# Patient Record
Sex: Female | Born: 1960 | Hispanic: Refuse to answer
Health system: Midwestern US, Community
[De-identification: ages and names within clinical notes are randomized; demographics above are authoritative.]

## PROBLEM LIST (undated history)

## (undated) DIAGNOSIS — K449 Diaphragmatic hernia without obstruction or gangrene: Secondary | ICD-10-CM

## (undated) DIAGNOSIS — M549 Dorsalgia, unspecified: Secondary | ICD-10-CM

## (undated) DIAGNOSIS — K121 Other forms of stomatitis: Secondary | ICD-10-CM

## (undated) DIAGNOSIS — G8929 Other chronic pain: Secondary | ICD-10-CM

## (undated) DIAGNOSIS — T148XXA Other injury of unspecified body region, initial encounter: Secondary | ICD-10-CM

## (undated) DIAGNOSIS — M48061 Spinal stenosis, lumbar region without neurogenic claudication: Secondary | ICD-10-CM

## (undated) HISTORY — PX: HERNIA REPAIR: SHX51

## (undated) HISTORY — PX: ABDOMINAL HYSTERECTOMY: SHX81

## (undated) HISTORY — PX: BACK SURGERY: SHX140

## (undated) MED ORDER — OXYCODONE-ACETAMINOPHEN 5 MG-325 MG TAB
5-325 mg | ORAL_TABLET | ORAL | Status: DC | PRN
Start: ? — End: 2013-05-06

## (undated) MED ORDER — OXYCODONE 10 MG TAB
10 mg | ORAL_TABLET | ORAL | Status: DC | PRN
Start: ? — End: 2014-03-29

## (undated) MED ORDER — METHOCARBAMOL 750 MG TAB
750 mg | ORAL_TABLET | Freq: Four times a day (QID) | ORAL | Status: AC
Start: ? — End: 2013-05-11

## (undated) MED ORDER — GABAPENTIN 300 MG CAP
300 mg | ORAL_CAPSULE | Freq: Three times a day (TID) | ORAL | Status: DC
Start: ? — End: 2013-10-31

## (undated) MED ORDER — KETOROLAC TROMETHAMINE 10 MG TAB
10 mg | ORAL_TABLET | Freq: Four times a day (QID) | ORAL | Status: DC | PRN
Start: ? — End: 2013-10-31

---

## 2013-03-31 LAB — URINALYSIS W/ REFLEX CULTURE
Bacteria: NEGATIVE /hpf
Bilirubin: NEGATIVE
Blood: NEGATIVE
Glucose: NEGATIVE mg/dL
Ketone: NEGATIVE mg/dL
Nitrites: NEGATIVE
Protein: NEGATIVE mg/dL
Specific gravity: 1.009 (ref 1.003–1.030)
Urobilinogen: 1 EU/dL (ref 0.2–1.0)
pH (UA): 7 (ref 5.0–8.0)

## 2013-03-31 MED ADMIN — diazepam (VALIUM) tablet 5 mg: ORAL | @ 17:00:00 | NDC 68084035911

## 2013-03-31 MED ADMIN — HYDROmorphone (PF) (DILAUDID) injection 1 mg: INTRAMUSCULAR | @ 17:00:00 | NDC 00409255201

## 2013-03-31 MED ADMIN — predniSONE (DELTASONE) tablet 60 mg: ORAL | @ 17:00:00 | NDC 00054001820

## 2013-03-31 NOTE — ED Provider Notes (Signed)
Patient is a 52 y.o. female presenting with back pain. The history is provided by the patient.   Back Pain   This is a recurrent problem. The problem has not changed since onset.The problem occurs constantly. The pain is present in the lumbar spine. The quality of the pain is described as similar to previous episodes and sharp (tingling ). Radiates to: right leg  The pain is at a severity of 3/10. The pain is mild. Pertinent negatives include no chest pain, no fever, no numbness, no headaches, no abdominal pain, no abdominal swelling, no bladder incontinence, no dysuria, no pelvic pain, no paresthesias, no paresis and no weakness. She has tried NSAIDs for the symptoms.   Pt states that she has history of a pinched nerved but unsure which one. The last week she was lifting things because she just moved and at work she lifts a tray all the time. She states that she has been staying in because it hurts to much to move.     Pt does smoke tobacco   Pt does not drink ETOH      Past Medical History   Diagnosis Date   ??? Depression    ??? Pinched nerve         Past Surgical History   Procedure Laterality Date   ??? Hx gyn       hysterectomy         History reviewed. No pertinent family history.     History     Social History   ??? Marital Status: LEGALLY SEPARATED     Spouse Name: N/A     Number of Children: N/A   ??? Years of Education: N/A     Occupational History   ??? Not on file.     Social History Main Topics   ??? Smoking status: Current Every Day Smoker -- 0.50 packs/day   ??? Smokeless tobacco: Not on file   ??? Alcohol Use: No   ??? Drug Use: No   ??? Sexually Active: Not on file     Other Topics Concern   ??? Not on file     Social History Narrative   ??? No narrative on file                  ALLERGIES: Tramadol      Review of Systems   Constitutional: Negative for fever and chills.   Respiratory: Negative for chest tightness, shortness of breath and wheezing.    Cardiovascular: Negative for chest pain and palpitations.    Gastrointestinal: Negative for abdominal pain.   Genitourinary: Negative for bladder incontinence, dysuria and pelvic pain.   Musculoskeletal: Positive for back pain.   Neurological: Negative for weakness, numbness, headaches and paresthesias.   All other systems reviewed and are negative.        Filed Vitals:    03/31/13 1227 03/31/13 1409   BP: 151/101 128/83   Pulse: 96    Temp: 98.4 ??F (36.9 ??C)    Resp: 18 16   Height: 5\' 2"  (1.575 m)    Weight: 81.647 kg (180 lb)    SpO2: 95% 100%            Physical Exam   Nursing note and vitals reviewed.  Constitutional: She is oriented to person, place, and time. She appears well-developed and well-nourished. No distress.   HENT:   Head: Normocephalic and atraumatic.   Right Ear: External ear normal.   Left Ear: External ear normal.  Nose: Nose normal.   Mouth/Throat: Oropharynx is clear and moist. No oropharyngeal exudate.   Eyes: Conjunctivae and EOM are normal. Pupils are equal, round, and reactive to light. Right eye exhibits no discharge. Left eye exhibits no discharge. No scleral icterus.   Neck: Normal range of motion. Neck supple.   Cardiovascular: Normal rate, regular rhythm, normal heart sounds and intact distal pulses.  Exam reveals no gallop and no friction rub.    No murmur heard.  Pulmonary/Chest: Effort normal and breath sounds normal. No respiratory distress. She has no wheezes.   Abdominal: Soft. Normal aorta and bowel sounds are normal. She exhibits no distension, no abdominal bruit, no pulsatile midline mass and no mass. There is no hepatosplenomegaly, splenomegaly or hepatomegaly. There is no tenderness. There is no rebound, no guarding and no CVA tenderness. No hernia. Hernia confirmed negative in the ventral area, confirmed negative in the right inguinal area and confirmed negative in the left inguinal area.   Abdomen exposed for exam.  NO aortic bruits,  No femoral bruits,   No pain with light or deep palpation   Musculoskeletal: Normal range of  motion. She exhibits no edema.   Tenderness to the lower lumbar   No stepoffs, no deformity  No redness, rashes, warmth, or cellulitis.  5/5 flexion/extension of hips bilaterally  5/5 great toes strength bilaterally  5/5 dorsiflexion/plantarflexion bilaterally  No saddle anesthesia present.  Walks on heels/toes    Neurological: She is alert and oriented to person, place, and time. She has normal strength and normal reflexes. No cranial nerve deficit. She exhibits normal muscle tone. Coordination normal.   Skin: Skin is warm and dry. No rash noted. She is not diaphoretic. No erythema.   Psychiatric: She has a normal mood and affect. Her behavior is normal. Judgment and thought content normal.        MDM     Amount and/or Complexity of Data Reviewed:   Clinical lab tests:  Ordered and reviewed   Discuss the patient with another provider:  Yes   Independant visualization of image, tracing, or specimen:  Yes      Procedures    Progress note:   I have discussed the xray with patient no compression or deformity seen. Will treat pain and have follow up     1. Back pain with radiculopathy: steroids, percocet and valium. Follow up with ortho given     Patient's results have been reviewed with them.  Patient and/or family have verbally conveyed their understanding and agreement of the patient's signs, symptoms, diagnosis, treatment and prognosis and additionally agree to follow up as recommended or return to the Emergency Room should their condition change prior to follow-up.  Discharge instructions have also been provided to the patient with some educational information regarding their diagnosis as well a list of reasons why they would want to return to the ER prior to their follow-up appointment should their condition change.    Have spoken with attending physician about pt complaint and history. Agrees with plan of care in the emergency room.

## 2013-03-31 NOTE — ED Notes (Signed)
Triage Note: Pt states that she has a pinched nerve and has been experiencing back pain for approx. 1 week after picking up a tray @ work; complains of numbness/pain/tingling in back and down R leg; denies any trauma or injury

## 2013-03-31 NOTE — ED Notes (Signed)
Patient reports pain is much better.  Patient ambulatory out of ED without any problems.

## 2013-04-01 NOTE — ED Provider Notes (Signed)
I was personally available for consultation in the emergency department.  I have reviewed the chart and agree with the documentation recorded by the MLP, including the assessment, treatment plan, and disposition.  Betsabe Iglesia C Chlora Mcbain, MD

## 2013-04-02 LAB — CULTURE, URINE
Colonies Counted: >100000
Colony Count: >100000

## 2013-05-06 LAB — URINALYSIS W/ REFLEX CULTURE
Bacteria: NEGATIVE /hpf
Bilirubin: NEGATIVE
Blood: NEGATIVE
Glucose: NEGATIVE mg/dL
Ketone: NEGATIVE mg/dL
Leukocyte Esterase: NEGATIVE
Nitrites: NEGATIVE
Protein: NEGATIVE mg/dL
Specific gravity: 1.016 (ref 1.003–1.030)
Urobilinogen: 1 EU/dL (ref 0.2–1.0)
pH (UA): 8 (ref 5.0–8.0)

## 2013-05-06 MED ADMIN — ketorolac (TORADOL) injection 30 mg: INTRAVENOUS | @ 15:00:00 | NDC 00409379501

## 2013-05-06 MED ADMIN — diazepam (VALIUM) injection 5 mg: INTRAVENOUS | @ 15:00:00 | NDC 00409127332

## 2013-05-06 MED ADMIN — sodium chloride 0.9 % bolus infusion 1,000 mL: INTRAVENOUS | @ 15:00:00 | NDC 00409798309

## 2013-05-06 MED FILL — KETOROLAC TROMETHAMINE 30 MG/ML INJECTION: 30 mg/mL (1 mL) | INTRAMUSCULAR | Qty: 1

## 2013-05-06 MED FILL — SODIUM CHLORIDE 0.9 % IV: INTRAVENOUS | Qty: 1000

## 2013-05-06 MED FILL — DIAZEPAM 5 MG/ML SYRINGE: 5 mg/mL | INTRAMUSCULAR | Qty: 2

## 2013-05-06 NOTE — ED Notes (Signed)
Nurse in room requesting urine sample.  Pt assisted out of bed without difficulty.  Pt ambulatory without assistance.  Pt then demands a wheel chair and to be taken to BR.  Pt encouraged to walk.  Pt continues with ambulate without assistance.  Pt continues to demand wheelchair.  Nurse places pt in wheelchair per request and wheeled to bathroom.  Once back in pt room.  Pt apologizing to nursing staff for being difficult.

## 2013-05-06 NOTE — ED Notes (Signed)
Pt requesting pain medication.  Nurse told pt doctor has ordered Toradol and Valium.  Pt states, "Toradol doesn't work.  I don't want it.  Its like taking ASA.  I need something stronger.  I have had it before and it doesn't touch the pain.  I want to see the doctor again."  MD aware.  Pt now states she will take the Toradol.  "I will take it and just tell the doctor when it doesn't work."

## 2013-05-06 NOTE — ED Notes (Signed)
Pt states she didn't take the prednisone that was prescribed during her last visit.  "It upset my stomach."

## 2013-05-06 NOTE — ED Notes (Signed)
Pt discharged by MD.  Pt with out questions.  Pt ambulatory out of department.

## 2013-05-06 NOTE — ED Notes (Signed)
Triage Note:  Pt arrived via EMS for chronic back pain.  Pt states she normally see's her PCP for the problem but was unable to get an appointment today.

## 2013-05-06 NOTE — ED Provider Notes (Signed)
HPI Comments: 52 y.o. female w/ PMH significant for depression and pinched nerve presents to ED via EMS with complaint of worsening generalized back pain which is a chronic issue accompanied by pain radiating to her right leg, numbness/tingling in her right leg and SOB due to pain. Pt states she has applied [facility], heating pad, and ice to her back w/ no relief of pain, pain is worse w/ movement, rates pain 10/10 in severity, pt did not sleep last night due to pain. Pt has been informed she has a pinched nerve and arthritis in the past. Pt denies recent strenuous physical activity, trauma, or MVC.    Pt denies nausea/vomiting or any other acute medical complaints.    Old chart reviewed:  Pt was seen by [facility] approximately 1 month ago for the same, x-ray of her back revealed lower thoracic and lumbar degenerative disease,no compression fracture deformity or subluxation. Pt was discharged home w/ prescription for 10 tabs of valium, 12 tabs of percocet, prednisone, and instructions to follow up with [facility] [facility].    Social hx: current daily smoker, 0.5 ppd. No alcohol use. No drug use.  Allergies: flexeril, tramadol  Surgical hx: hyserectomy    Note written by Monico Blitz, Scribe, as dictated by Thornton Papas, MD 10:16 AM     The history is provided by the patient.        Past Medical History   Diagnosis Date   ??? Depression    ??? Pinched nerve         Past Surgical History   Procedure Laterality Date   ??? Hx gyn       hysterectomy         History reviewed. No pertinent family history.     History     Social History   ??? Marital Status: LEGALLY SEPARATED     Spouse Name: N/A     Number of Children: N/A   ??? Years of Education: N/A     Occupational History   ??? Not on file.     Social History Main Topics   ??? Smoking status: Current Every Day Smoker -- 0.50 packs/day   ??? Smokeless tobacco: Not on file   ??? Alcohol Use: No   ??? Drug Use: No   ??? Sexually Active: Not on file     Other Topics Concern   ??? Not on file     Social  History Narrative   ??? No narrative on file                  ALLERGIES: Flexeril and Tramadol      Review of Systems   Respiratory: Positive for shortness of breath (due to pain).    Gastrointestinal: Negative for nausea and vomiting.   Musculoskeletal: Positive for myalgias (right leg) and back pain (generalized, pain 10/10).   Neurological: Positive for numbness (right leg).   All other systems reviewed and are negative.      Note written by Monico Blitz, Scribe, as dictated by Thornton Papas, MD 10:17 AM    Filed Vitals:    05/06/13 0943   BP: 165/91   Pulse: 92   Temp: 98.6 ??F (37 ??C)   Resp: 18   Height: 5\' 4"  (1.626 m)   Weight: 81.647 kg (180 lb)   SpO2: 100%            Physical Exam   Nursing note and vitals reviewed.  Constitutional: She is  oriented to person, place, and time. She appears well-developed and well-nourished. No distress.   She is tearful and crying.   HENT:   Head: Normocephalic and atraumatic.   Right Ear: External ear normal.   Left Ear: External ear normal.   Nose: Nose normal.   Mouth/Throat: Oropharynx is clear and moist.   Eyes: Conjunctivae and EOM are normal. Pupils are equal, round, and reactive to light. No scleral icterus.   Neck: Neck supple. No JVD present. No tracheal deviation present. No thyromegaly present.   Cardiovascular: Normal rate, regular rhythm and normal heart sounds.  Exam reveals no gallop and no friction rub.    No murmur heard.  Pulmonary/Chest: Effort normal and breath sounds normal. No respiratory distress. She has no wheezes. She has no rales. She exhibits no tenderness.   Abdominal: Soft. Bowel sounds are normal. She exhibits no distension and no mass. There is no tenderness. There is no rebound and no guarding.   Musculoskeletal: Normal range of motion. She exhibits no edema and no tenderness.   Lymphadenopathy:     She has no cervical adenopathy.   Neurological: She is alert and oriented to person, place, and time. She displays abnormal reflex (flat  bilaterally. Subjective sensation preserved). No cranial nerve deficit. Coordination normal.   Straight leg raise: right leg at 10 degrees produced pain in her back. Left leg normal, no back pain.   Skin: Skin is warm and dry. No rash noted. She is not diaphoretic. No erythema.   Psychiatric: She has a normal mood and affect. Her behavior is normal. Judgment and thought content normal.      Note written by Monico Blitz, Scribe, as dictated by Thornton Papas, MD 10:17 AM    MDM     Differential Diagnosis; Clinical Impression; Plan:     Impression: Exacerbation of chronic back pain patient was seen here approximately one month ago for same. Patient has not followed up with orthopedics, states she did not take her Medrol dosepak. 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    The patient requested opiate medication, I declined this and informed her that it was inappropriate and will treat with muscle oximeter is prednisone taper and nonsteroidal anti-inflammatory medication.      Procedures

## 2013-05-06 NOTE — ED Notes (Signed)
Pt given warm blanket.

## 2013-10-04 NOTE — Progress Notes (Signed)
Pt here to establish care  States she has "L1-3 disk issues" Has appointment 11/4 to see specialist, but is in extreme pain- was seen at [facility] yesterday and given Valium 2mg  and diclofenac DR 75 mg- with no relief.

## 2013-10-04 NOTE — Telephone Encounter (Signed)
[facility], [facility] I updated it. Pt is waiting.

## 2013-10-04 NOTE — Telephone Encounter (Signed)
Nurse called in

## 2013-10-04 NOTE — Progress Notes (Signed)
Nicole [name] is a 52 y.o. female   Chief Complaint   Patient presents with   ??? Establish Care   ??? Back Pain    and states she is waiting to see a specialist in Spine.   Pt has sciatica and has weakness in her legs and went to ER [facility]) yesterday and received dilaudid IM, Valium 2mg  BID, Diclofenac 75mg .  Appt w/ spine is Nov 4.  Pt states the sciatica is on her R side and goes down to her toes.  Pt states she got a bottle of ibuprofen yesterday and took approx 19.  Pt states she was going to PT but had to stop it.  Pain right now is a 10/10 but pt appears in NAD.  Pt is not fasting.  Pt also requesting "shot of dilaudid"     Chief Complaint   Patient presents with   ??? Establish Care   ??? Back Pain     she is a 52 y.o. year old female who presents for evalution.    Reviewed PmHx, RxHx, FmHx, SocHx, AllgHx and updated and dated in the chart.    Review of Systems - negative except as listed above in the HPI    Objective:     Filed Vitals:    10/04/13 1332   BP: 128/78   Pulse: 73   Resp: 22   Height: 5\' 1"  (1.549 m)   Weight: 174 lb 12.8 oz (79.289 kg)   SpO2: 100%     Physical Examination: General appearance - alert, well appearing, and in no distress  Ears - bilateral TM's and external ear canals normal  Mouth - mucous membranes moist, pharynx normal without lesions  Neck - supple, no significant adenopathy  Chest - clear to auscultation, no wheezes, rales or rhonchi, symmetric air entry  Heart - normal rate, regular rhythm, normal S1, S2, no murmurs, rubs, clicks or gallops  Back exam - Pt recoils to light touch of lumbar spine, no palpable spasm      Assessment/ Plan:   Teckla was seen today for establish care and back pain.    Diagnoses and associated orders for this visit:    Low back pain  - REFERRAL TO SPINE SURGERY  -     Denied pain medication, PMP printed and pt has received pain meds recently from multiple pharmacies and multiple physicians which she was dishonest about.    Sciatica neuralgia, right  -  REFERRAL TO SPINE SURGERY    Other Orders  - diclofenac EC (VOLTAREN) 75 mg EC tablet; Take  by mouth.  - diazepam (VALIUM) 2 mg tablet; Take  by mouth every six (6) hours as needed for Anxiety.  - multivitamin (ONE A DAY) tablet; Take 1 tablet by mouth daily.  - vitamin c-vitamin e (CRANBERRY CONCENTRATE) cap; Take  by mouth.  - esomeprazole (NEXIUM) 20 mg capsule; Take  by mouth daily.         Follow-up Disposition:  Return if symptoms worsen or fail to improve.    I have discussed the diagnosis with the patient and the intended plan as seen in the above orders.  The patient has received an after-visit summary and questions were answered concerning future plans.     Medication Side Effects and Warnings were discussed with patient      Dietrich Pates, DO

## 2013-10-31 LAB — METABOLIC PANEL, COMPREHENSIVE
A-G Ratio: 1.2 (ref 1.1–2.2)
ALT (SGPT): 24 U/L (ref 12–78)
AST (SGOT): 21 U/L (ref 15–37)
Albumin: 3.6 g/dL (ref 3.5–5.0)
Alk. phosphatase: 62 U/L (ref 45–117)
Anion gap: 7 mmol/L (ref 5–15)
BUN/Creatinine ratio: 20 (ref 12–20)
BUN: 13 MG/DL (ref 6–20)
Bilirubin, total: 0.2 MG/DL (ref 0.2–1.0)
CO2: 29 mmol/L (ref 21–32)
Calcium: 9.1 MG/DL (ref 8.5–10.1)
Chloride: 107 mmol/L (ref 97–108)
Creatinine: 0.64 MG/DL (ref 0.45–1.15)
GFR est AA: >60 mL/min/{1.73_m2} (ref >60)
GFR est non-AA: >60 mL/min/{1.73_m2} (ref >60)
Globulin: 3.1 g/dL (ref 2.0–4.0)
Glucose: 93 mg/dL (ref 65–100)
Potassium: 3.6 mmol/L (ref 3.5–5.1)
Protein, total: 6.7 g/dL (ref 6.4–8.2)
Sodium: 143 mmol/L (ref 136–145)

## 2013-10-31 LAB — URINALYSIS W/ REFLEX CULTURE
Bilirubin: NEGATIVE
Blood: NEGATIVE
Glucose: NEGATIVE mg/dL
Ketone: NEGATIVE mg/dL
Nitrites: POSITIVE — AB
Specific gravity: 1.024 (ref 1.003–1.030)
Urobilinogen: 0.2 EU/dL (ref 0.2–1.0)
pH (UA): 6.5 (ref 5.0–8.0)

## 2013-10-31 LAB — PTT: aPTT: 27.6 s (ref 22.1–30.0)

## 2013-10-31 LAB — EKG, 12 LEAD, INITIAL
Atrial Rate: 62 {beats}/min
Calculated P Axis: 24 degrees
Calculated R Axis: 43 degrees
Calculated T Axis: 52 degrees
Diagnosis: NORMAL
P-R Interval: 156 ms
Q-T Interval: 416 ms
QRS Duration: 82 ms
QTC Calculation (Bezet): 422 ms
Ventricular Rate: 62 {beats}/min

## 2013-10-31 LAB — CBC WITH AUTOMATED DIFF
ABS. BASOPHILS: 0 10*3/uL (ref 0.0–0.1)
ABS. EOSINOPHILS: 0.2 10*3/uL (ref 0.0–0.4)
ABS. LYMPHOCYTES: 1.9 10*3/uL (ref 0.8–3.5)
ABS. MONOCYTES: 0.5 10*3/uL (ref 0.0–1.0)
ABS. NEUTROPHILS: 4 10*3/uL (ref 1.8–8.0)
BASOPHILS: 1 % (ref 0–1)
EOSINOPHILS: 2 % (ref 0–7)
HCT: 34.3 % — ABNORMAL LOW (ref 35.0–47.0)
HGB: 11 g/dL — ABNORMAL LOW (ref 11.5–16.0)
LYMPHOCYTES: 29 % (ref 12–49)
MCH: 26.2 PG (ref 26.0–34.0)
MCHC: 32.1 g/dL (ref 30.0–36.5)
MCV: 81.7 FL (ref 80.0–99.0)
MONOCYTES: 7 % (ref 5–13)
NEUTROPHILS: 61 % (ref 32–75)
PLATELET: 358 10*3/uL (ref 150–400)
RBC: 4.2 M/uL (ref 3.80–5.20)
RDW: 16.7 % — ABNORMAL HIGH (ref 11.5–14.5)
WBC: 6.6 10*3/uL (ref 3.6–11.0)

## 2013-10-31 LAB — TYPE & SCREEN
ABO/Rh(D): B POS
Antibody screen: NEGATIVE

## 2013-10-31 LAB — HEMOGLOBIN A1C WITH EAG
Est. average glucose: 117 mg/dL
Hemoglobin A1c: 5.7 % (ref 4.2–6.3)

## 2013-10-31 LAB — PROTHROMBIN TIME + INR
INR: 1.1 (ref 0.9–1.1)
Prothrombin time: 10.7 s (ref 9.4–11.7)

## 2013-10-31 LAB — TYPE AND SCREEN
ABO/Rh: B POS
Antibody Screen: NEGATIVE

## 2013-10-31 MED ADMIN — HYDROcodone-acetaminophen (NORCO) 10-325 mg tablet 1 tablet: ORAL | @ 20:00:00 | NDC 53746011001

## 2013-10-31 MED ADMIN — methocarbamol (ROBAXIN) tablet 500 mg: ORAL | @ 20:00:00 | NDC 31722053301

## 2013-10-31 NOTE — ED Notes (Signed)
Chronic back pain.  Back pain today.

## 2013-10-31 NOTE — ED Provider Notes (Signed)
Patient is a 52 y.o. female presenting with back pain. The history is provided by the patient.   Back Pain   This is a chronic (Chronic pain given 15 days supply of vicodin on 18 days ago.  Typical pain, no FNC, no BBI, hx of sicatica) problem. The current episode started more than 1 week ago. The problem has not changed since onset.The problem occurs constantly. Patient reports not work related injury.The pain is associated with a remote injury. The pain is present in the lumbar spine and right side. The quality of the pain is described as aching. Radiates to: right buttock. The pain is severe. The pain is the same all the time. Associated symptoms include numbness and tingling. Pertinent negatives include no chest pain, no fever, no weight loss, no abdominal pain, no abdominal swelling, no bowel incontinence, no perianal numbness, no bladder incontinence, no dysuria, no pelvic pain, no leg pain, no paresthesias, no paresis and no weakness. She has tried analgesics for the symptoms. The treatment provided moderate relief. Risk factors include obesity, lack of exercise and a sedentary lifestyle. awaiting surgery       Past Medical History   Diagnosis Date   ??? Pinched nerve    ??? Ill-defined condition      depression   ??? Hypertension    ??? GERD (gastroesophageal reflux disease)         Past Surgical History   Procedure Laterality Date   ??? Pr abdomen surgery proc unlisted  05-2013     hernia   ??? Hx gyn       hysterectomy   ??? Hx cesarean section       x 3   ??? Hx hysterectomy           Family History   Problem Relation Age of Onset   ??? Diabetes Mother    ??? Stroke Mother    ??? Heart Disease Mother    ??? Cancer Father    ??? Hypertension Sister         History     Social History   ??? Marital Status: LEGALLY SEPARATED     Spouse Name: N/A     Number of Children: N/A   ??? Years of Education: N/A     Occupational History   ??? Not on file.     Social History Main Topics   ??? Smoking status: Current Every Day Smoker -- 0.25 packs/day for  25 years   ??? Smokeless tobacco: Never Used   ??? Alcohol Use: No   ??? Drug Use: No   ??? Sexually Active: Not on file     Other Topics Concern   ??? Not on file     Social History Narrative    ** Merged History Encounter **                       ALLERGIES: Flexeril; Flexeril; and Tramadol      Review of Systems   Constitutional: Negative for fever, chills, weight loss, activity change and appetite change.   Cardiovascular: Negative for chest pain.   Gastrointestinal: Negative for abdominal pain and bowel incontinence.   Genitourinary: Negative for bladder incontinence, dysuria, urgency, decreased urine volume and pelvic pain.   Musculoskeletal: Positive for back pain.   Neurological: Positive for tingling and numbness. Negative for dizziness, weakness, light-headedness and paresthesias.       Filed Vitals:    10/31/13 1351   BP: 177/88  Pulse: 80   Temp: 97.7 ??F (36.5 ??C)   Resp: 20   Height: 5\' 1"  (1.549 m)   Weight: 80.287 kg (177 lb)   SpO2: 100%            Physical Exam   Constitutional: She is oriented to person, place, and time. She appears well-developed and well-nourished. No distress.   HENT:   Head: Normocephalic and atraumatic.   Neurological: She is alert and oriented to person, place, and time. No cranial nerve deficit.   Normal gait   Skin: She is not diaphoretic.        MDM    Procedures

## 2013-10-31 NOTE — Other (Signed)
[facility]  PREOPERATIVE INSTRUCTIONS    Surgery Date:   11-15-13 tuesday  Surgery arrival time given by surgeon: NO   If ???no???,[facility] staff will call you between 4 PM- 8 PM the day before surgery with your arrival time. If your surgery is on a Monday, we will call you the preceding Friday.       Please call [phone] after 8 PM if you did not receive your arrival time. 11-14-13    1. Please report at the designated time to the 2nd Floor Admitting Desk.  Bring your insurance card, photo identification, and any copayment ( if applicable).  2. You must have a responsible adult to drive you home. You need to have a responsible adult to stay with you the first 24 hours after surgery if you are going home the same day of your surgery and you should not drive a car for 24 hours following your surgery.  3. Nothing to eat or drink after midnight the night before surgery. This includes no water, gum, mints, coffee, juice, etc.  Please note special instructions, if applicable, below for medications.  4. MEDICATIONS TO TAKE THE MORNING OF SURGERY WITH A SIP OF WATER: nexium  5. No alcoholic beverages 24 hours before or after your surgery.  6. If you are being admitted to the hospital,please leave personal belongings/luggage in your car until you have an assigned hospital room number.  7. Stop Aspirin and/or any non-steroidal anti-inflammatory drugs (i.e. Ibuprofen, Naproxen, Advil, Aleve) as directed by your surgeon.  You may take Tylenol.        Stop herbal supplements 1 week prior to  surgery.  8. If you are currently taking Plavix, Coumadin,or any other blood-thinning/anticoagulant medication contact your surgeon for instructions.  9. Please wear comfortable clothes. Wear your glasses instead of contacts. We ask that all money, jewelry and valuables be left at home. Wear no make up, particularly mascara, the day of surgery.   10.  All body piercings, rings,and jewelry need to be removed and left at home.    Please wear your hair loose  or down. Please no pony-tails, buns, or any metal hair accessories. If you shower the morning of surgery, please do not apply any lotions, powders, or deodorants afterwards.   Do not shave any body area within 24 hours of your surgery.  11. Please follow all instructions to avoid any potential surgical cancellation.  12.  Should your physical condition change, (i.e. fever, cold, flu, etc.) please notify your surgeon as soon as possible.  13. It is important to be on time. If a situation occurs where you may be delayed, please call:  [phone] / [phone] on the day of surgery.  14. The Preadmission Testing staff can be reached at [phone]..  15. Special instructions: Bring insurance cards and picture ID.  Valet Parking.  Pain Brochure reviewed and given to patient.  Use Chlorhexidine wash for 3 days prior to surgery as instructed.  If your HgbA1c is elevated, you will get a phone call from our diabetic treatment center.    The patient was contacted  in person.   She  verbalize  understanding of all instructions does not  need reinforcement.

## 2013-10-31 NOTE — ED Notes (Signed)
Pt. States her ride is in the parking lot.

## 2013-10-31 NOTE — ED Notes (Signed)
Pt. States she is "out of my Lortab and can't get in touch with Dr. Luciana Axe because he is in surgery."

## 2013-10-31 NOTE — ED Notes (Signed)
In to discharge patient, patient states "you can keep your papers, I know what to do." Discharge papers shredded. Pt refuses vital signs.

## 2013-10-31 NOTE — H&P (Addendum)
PAT Pre-Op History & Physical    Patient: Nicole [name]                  MRN: [id number]          SSN: [id number]  Date of Birth: [birth year]          Age: 52 y.o.             Sex: female                Subjective:   Patient is a 52 y.o. African American female who presents with history of chronic low back pain that radiates down her right leg to her ankle. Failed PT, NSAIDs, and narcotic pain medication.   The patient was evaluated in surgeon's office and it was determined that the most appropriate plan of care is to proceed with surgical intervention.  Patient's PCP Dietrich Pates, DO            Past Medical History   Diagnosis Date   ??? Pinched nerve    ??? Ill-defined condition      depression   ??? Hypertension    ??? GERD (gastroesophageal reflux disease)       Past Surgical History   Procedure Laterality Date   ??? Pr abdomen surgery proc unlisted  05-2013     hernia   ??? Hx gyn       hysterectomy   ??? Hx cesarean section       x 3   ??? Hx hysterectomy        Prior to Admission medications    Medication Sig Start Date End Date Taking? Authorizing Provider   losartan (COZAAR) 50 mg tablet Take 50 mg by mouth daily.   Yes Historical Provider   multivitamin (ONE A DAY) tablet Take 1 tablet by mouth daily.   Yes Historical Provider   vitamin c-vitamin e (CRANBERRY CONCENTRATE) cap Take  by mouth.   Yes Historical Provider   esomeprazole (NEXIUM) 20 mg capsule Take  by mouth daily.   Yes Historical Provider     Current Outpatient Prescriptions   Medication Sig   ??? losartan (COZAAR) 50 mg tablet Take 50 mg by mouth daily.   ??? multivitamin (ONE A DAY) tablet Take 1 tablet by mouth daily.   ??? vitamin c-vitamin e (CRANBERRY CONCENTRATE) cap Take  by mouth.   ??? esomeprazole (NEXIUM) 20 mg capsule Take  by mouth daily.     No current facility-administered medications for this encounter.      Allergies   Allergen Reactions   ??? Flexeril [Cyclobenzaprine] Other (comments)     "It makes me loopy."    ??? Flexeril [Cyclobenzaprine]  Other (comments)   ??? Tramadol Itching      History   Substance Use Topics   ??? Smoking status: Current Every Day Smoker -- 0.25 packs/day for 25 years   ??? Smokeless tobacco: Never Used   ??? Alcohol Use: No      Family History   Problem Relation Age of Onset   ??? Diabetes Mother    ??? Stroke Mother    ??? Heart Disease Mother    ??? Cancer Father    ??? Hypertension Sister         Review of Systems    Patient denies chest pain, tightness, pain radiating down left arm, palpitations, dizziness, lightheadedness, fevers, chills. Patient denies shortness of breath, wheezing, diarrhea, constipation, abdominal pain. Patient denies urinary problems, dysuria, hesitancy, urgency.  Denies skin breakdown, rashes, insect bites or open area.        Objective:   Patient Vitals for the past 8 hrs:   BP Temp Pulse SpO2 Height Weight   10/31/13 1216 150/80 mmHg 97.7 ??F (36.5 ??C) 76 98 % 5\' 1"  (1.549 m) 80.287 kg (177 lb)     Temp (24hrs), Avg:97.7 ??F (36.5 ??C), Min:97.7 ??F (36.5 ??C), Max:97.7 ??F (36.5 ??C)      Physical Exam:     General: Alert, cooperative, no distress, appears stated age.   Eyes: Conjunctivae/corneas clear. EOMs intact.   Ears: Normal TMs and external ear canals both ears.   Nose: Nares normal.   Mouth/Throat: Lips, mucosa, and tongue normal. Teeth and gums normal.   Neck: Supple, symmetrical, trachea midline.   Back: Symmetric   Lungs: Clear to auscultation bilaterally.   Heart: Regular rate and rhythm, S1, S2 normal, no murmur, click, rub or gallop.   Abdomen: Soft, non-tender. Bowel sounds normal. No masses, No                      Organumegaly.   Musculoskeletal:  Decreased ROM in lumbar spine due to discomfort   Extremities:Extremities normal, atraumatic, no cyanosis or edema.  Calfs                                 Supple, non tender to palpation.   Pulses: 2+ and symmetric on upper extremities.  CR  <2 seconds   Skin: Skin color, texture, turgor normal.  No rashes or lesions.   Lymph nodes: No adenopathy.   Neurologic:  CNII-XII grossly intact.  Neurovascularly intact distally.    Labs:   Recent Results (from the past 24 hour(s))   EKG, 12 LEAD, INITIAL    Collection Time     10/31/13 12:38 PM       Result Value Range    Ventricular Rate 62      Atrial Rate 62      P-R Interval 156      QRS Duration 82      Q-T Interval 416      QTC Calculation (Bezet) 422      Calculated P Axis 24      Calculated R Axis 43      Calculated T Axis 52      Diagnosis        Value: Normal sinus rhythm      Nonspecific T wave abnormality      Abnormal ECG      No previous ECGs available       Assessment:       Chronic low back pain with radiculopathy      Plan:     Scheduled for L5-S1 foraminotomy    Labs, EKG, and CXR done.  STAT T&S ordered am of surgery. T&S expires 11-14-13. DOS 11-15-13.  11-03-13- abnormal ua and abnormal urine culture faxed to Dr. Gregor Hams office along with a Treatment plan form  11-04-13- received Treatment plan form Dr. Luciana Axe- Cipro 500mg  po BID for 7 days.  Rest of the labs, EKG, and CXR reviewed and were unremarkable     Diego Cory, NP

## 2013-11-01 LAB — CULTURE, MRSA

## 2013-11-01 NOTE — Progress Notes (Signed)
Quick Note:    No ABX  ______

## 2013-11-02 LAB — CULTURE, URINE
Colonies Counted: >100000
Colony Count: >100000

## 2013-11-04 NOTE — Progress Notes (Signed)
Quick Note:    Spoke with patient who states she felt no improvement from ED visit but no worse- no fever, chills, vomiting. Discussed urine cx result and need for abx. She is having back surgery on 12/2. I advised to return if fever, vomiting or worsening sx's develop.  Called in Macrobid 100mg  1 tab pO BID x 7 days #14 to [facility] on [address] in [place].  ______

## 2013-11-15 ENCOUNTER — Inpatient Hospital Stay: Payer: PRIVATE HEALTH INSURANCE

## 2013-11-15 LAB — TYPE & SCREEN
ABO/Rh(D): B POS
Antibody screen: NEGATIVE

## 2013-11-15 LAB — TYPE AND SCREEN
ABO/Rh: B POS
Antibody Screen: NEGATIVE

## 2013-11-15 MED ADMIN — propofol (DIPRIVAN) 10 mg/mL injection: INTRAVENOUS | @ 13:00:00 | NDC 63323026920

## 2013-11-15 MED ADMIN — ceFAZolin (ANCEF) injection: INTRAVENOUS | @ 13:00:00 | NDC 00781345170

## 2013-11-15 MED ADMIN — succinylcholine (ANECTINE) injection: INTRAVENOUS | @ 13:00:00 | NDC 00409662902

## 2013-11-15 MED ADMIN — ceFAZolin in 0.9% NS (ANCEF) IVPB soln 2 g: INTRAVENOUS | @ 20:00:00 | NDC 09999966850

## 2013-11-15 MED ADMIN — ondansetron (ZOFRAN) injection: INTRAVENOUS | @ 13:00:00 | NDC 00409112012

## 2013-11-15 MED ADMIN — 0.9% sodium chloride infusion: INTRAVENOUS | @ 15:00:00 | NDC 00409798309

## 2013-11-15 MED ADMIN — bupivacaine 0.25% -EPINEPHrine 1:200,000 (SENSORCAINE) 0.25 %-1:200,000 injection 75 mg: SUBCUTANEOUS | @ 14:00:00 | NDC 00409904217

## 2013-11-15 MED ADMIN — rocuronium (ZEMURON) injection: INTRAVENOUS | @ 13:00:00 | NDC 10139023505

## 2013-11-15 MED ADMIN — sodium chloride (NS) flush 5-10 mL: INTRAVENOUS | @ 12:00:00 | NDC 87701099893

## 2013-11-15 MED ADMIN — diazepam (VALIUM) tablet 5 mg: ORAL | @ 20:00:00 | NDC 63739007310

## 2013-11-15 MED ADMIN — HYDROmorphone (PF) (DILAUDID) injection: INTRAVENOUS | @ 15:00:00 | NDC 59011044225

## 2013-11-15 MED ADMIN — bacitracin injection 50,000 Units: @ 13:00:00 | NDC 72572002510

## 2013-11-15 MED ADMIN — methocarbamol (ROBAXIN) tablet 500 mg: ORAL | @ 22:00:00 | NDC 31722053301

## 2013-11-15 MED ADMIN — midazolam (VERSED) injection: INTRAVENOUS | @ 13:00:00 | NDC 00409230517

## 2013-11-15 MED ADMIN — glycopyrrolate (ROBINUL) injection: INTRAVENOUS | @ 14:00:00 | NDC 10019001639

## 2013-11-15 MED ADMIN — esomeprazole (NEXIUM) capsule 20 mg: ORAL | @ 17:00:00 | NDC 70000009501

## 2013-11-15 MED ADMIN — oxyCODONE IR (ROXICODONE) tablet 15 mg: ORAL | @ 22:00:00 | NDC 00406055223

## 2013-11-15 MED ADMIN — lactated ringers infusion: INTRAVENOUS | @ 12:00:00 | NDC 00409795309

## 2013-11-15 MED ADMIN — thrombin (THROMBOGEN) topical solution 20,000 Units: TOPICAL | @ 14:00:00 | NDC 60793021720

## 2013-11-15 MED ADMIN — HYDROmorphone (PF) (DILAUDID) injection 0.25-1 mg: INTRAVENOUS | @ 15:00:00 | NDC 00409255201

## 2013-11-15 MED ADMIN — vancomycin (VANCOCIN) injection: @ 14:00:00 | NDC 00069258903

## 2013-11-15 MED ADMIN — HYDROmorphone (PF) (DILAUDID) injection 0.5 mg: INTRAVENOUS | @ 20:00:00 | NDC 00409255201

## 2013-11-15 MED ADMIN — lidocaine (PF) (XYLOCAINE) 20 mg/mL (2 %) injection: INTRAVENOUS | @ 13:00:00 | NDC 63323049507

## 2013-11-15 MED ADMIN — lactated ringers infusion: INTRAVENOUS | @ 14:00:00 | NDC 00338011704

## 2013-11-15 MED ADMIN — bacitracin injection 50,000 Units: @ 14:00:00 | NDC 00009023301

## 2013-11-15 MED ADMIN — dexamethasone (DECADRON) injection: INTRAVENOUS | @ 14:00:00 | NDC 00641036725

## 2013-11-15 MED ADMIN — methocarbamol (ROBAXIN) tablet 500 mg: ORAL | @ 20:00:00 | NDC 31722053301

## 2013-11-15 MED ADMIN — oxyCODONE IR (ROXICODONE) tablet 10 mg: ORAL | @ 17:00:00 | NDC 00406055223

## 2013-11-15 MED ADMIN — neostigmine (PROSTIGMINE) injection: INTRAVENOUS | @ 14:00:00 | NDC 00517003325

## 2013-11-15 MED ADMIN — losartan (COZAAR) tablet 50 mg: ORAL | @ 17:00:00 | NDC 68382013616

## 2013-11-15 MED ADMIN — lactated ringers infusion: INTRAVENOUS | @ 12:00:00 | NDC 00338011704

## 2013-11-15 MED ADMIN — gelatin absorbable (GELFOAM) 100 sponge: TOPICAL | @ 14:00:00 | NDC 00009034201

## 2013-11-15 MED ADMIN — celecoxib (CELEBREX) capsule 400 mg: ORAL | @ 12:00:00 | NDC 00025153002

## 2013-11-15 MED ADMIN — ceFAZolin in 0.9% NS (ANCEF) IVPB soln 2 g: INTRAVENOUS | @ 12:00:00 | NDC 44567084025

## 2013-11-15 MED ADMIN — HYDROmorphone (PF) (DILAUDID) injection: INTRAVENOUS | @ 14:00:00 | NDC 59011044225

## 2013-11-15 MED ADMIN — fentaNYL citrate (PF) injection: INTRAVENOUS | @ 13:00:00 | NDC 10019003727

## 2013-11-15 MED ADMIN — bupivacaine 0.25% -EPINEPHrine 1:200,000 (SENSORCAINE) 0.25 %-1:200,000 injection 75 mg: SUBCUTANEOUS | @ 13:00:00 | NDC 00961002004

## 2013-11-15 NOTE — Other (Signed)
Type and screen redrawn and sent to due to hemolysis.

## 2013-11-15 NOTE — Progress Notes (Signed)
12:30 PM   IP consult for discharge planning noted. Attempted to meet with the pt, unable to assess her since she is in pain and is receiving pain medications. I will continue to follow up.    Elenora Fender RN

## 2013-11-15 NOTE — Anesthesia Post-Procedure Evaluation (Signed)
Post-Anesthesia Evaluation and Assessment    Patient: Nicole [name] MRN: [id number]  SSN: [id number]    Date of Birth: June 17, 1961  Age: 52 y.o.  Sex: female       Cardiovascular Function/Vital Signs  Visit Vitals   Item Reading   ??? BP 107/86   ??? Pulse 72   ??? Temp 37 ??C (98.6 ??F)   ??? Resp 12   ??? SpO2 99%       Patient is status post general anesthesia for Procedure(s):  L5-S1 RIGHT FORAMINOTOMY.    Nausea/Vomiting: None    Postoperative hydration reviewed and adequate.    Pain:  Pain Scale 1: Numeric (0 - 10) (11/15/13 1028)  Pain Intensity 1: 7 (11/15/13 1028)   Managed    Neurological Status:   Neuro (WDL): Exceptions to WDL (11/15/13 0944)  Neuro  Neurologic State: Drowsy (11/15/13 2956)  Orientation Level: Appropriate for age;Oriented X4 (11/15/13 2130)  Cognition: Appropriate safety awareness;Appropriate for age attention/concentration;Appropriate decision making (11/15/13 0611)  Speech: Clear (11/15/13 8657)  LUE Motor Response: Purposeful (11/15/13 0944)  LLE Motor Response: Purposeful (11/15/13 0944)  RUE Motor Response: Purposeful (11/15/13 0944)  RLE Motor Response: Purposeful (11/15/13 0944)   At baseline    Mental Status and Level of Consciousness: Alert and oriented     Pulmonary Status:   O2 Device: Nasal cannula (11/15/13 0947)   Adequate oxygenation and airway patent    Complications related to anesthesia: None    Post-anesthesia assessment completed. No concerns    Signed By: Glendora Score, MD     November 15, 2013

## 2013-11-15 NOTE — Other (Signed)
TRANSFER - OUT REPORT:    Verbal report given to Meghan, RN on Nicole [name]  being transferred to 509 for routine post - op       Report consisted of patient???s Situation, Background, Assessment and   Recommendations(SBAR).     Information from the following report(s) SBAR, Kardex and MAR was reviewed with the receiving nurse.    Opportunity for questions and clarification was provided.      Patient transported with:   Registered Nurse

## 2013-11-15 NOTE — Op Note (Signed)
Name:      [place], [place]                                          Surgeon:        Magda Paganini, MD  Account #: [id number]                 Surgery Date:   11/15/2013  DOB:       1961-06-17  Age:       52                           Location:                                 OPERATIVE REPORT      PREOPERATIVE DIAGNOSIS: L5-S1 right foraminal stenosis.    POSTOPERATIVE DIAGNOSIS: L5-S1 right foraminal stenosis.    PROCEDURES PERFORMED: L5-S1 laminectomy and foraminotomy on the right-hand  side.    COMPLICATIONS: None.    ESTIMATED BLOOD LOSS: Minimal.    SURGEON:  Smith Mince, MD    ASSISTANT: Haskel Schroeder, Physician Assistant    ANESTHESIA: General endotracheal.    PREOPERATIVE ANTIBIOTICS: Two grams of Ancef.    INTRAOPERATIVE ANTIBIOTICS:  Vancomycin 500 mg sprinkled in the wound.    INDICATIONS FOR PROCEDURE: This is a 52 year old woman who has had back and  right leg issues for an extended period of time, has had multiple  injections, therapies, MRI showing severe  foraminal stenosis and finally  elected to undergo surgical intervention, understands the risks and  benefits and wants to proceed.    DESCRIPTION OF PROCEDURE: The patient was taken to the operating room,  placed under general endotracheal anesthesia, prone position on the Wilson  frame. The back was prepped and draped in sterile manner. Incision was made  over L5-S1 level after taking an x-ray to confirm this level. Sharp  dissection through the skin about 4 cm in length, Bovie electrocautery and  Cobb elevation down to the spinous processes and then over the lamina to  the facet joints bilaterally. X-rays taken again with a curette down at the  L5-S1 level and found to be appropriately placed. We then place  self-retaining retractor, then stopped the bleeding with Bovie  electrocautery, do a laminectomy of the L5-S1 level undercutting the S1 on  the right-hand side and L5 and following the L5 nerve root all the way out  its neural foramen,  undercutting the facet joint.  At the time of getting  into this area, we did place a Woodson and a Murphy ball-tipped probe and  found that this foramen was exquisitely tight. We opened it up so that the  2-3 mm Murphy's ball fit with an extra millimeter of free space, stopped  bleeding with bipolar electrocautery. We undercut the facet joint fairly  dramatically but left at least half the joint intact. Following this, we  irrigated and then sprinkled in the vancomycin powder and closed the wound  in multiple layers, ending with a subcuticular and Steri-Strips. The  patient tolerated this well and went to the recovery room in satisfactory  condition.  We did inject the wound with 20 mg of Marcaine with epinephrine  0.25% to help with pain control.    SPECIMENS  REMOVED:  0          Magda Paganini, MD    cc:   Magda Paganini, MD        SMF/wmx; D: 11/15/2013 09:58 A; T: 11/15/2013 11:40 A; Doc# [id number]; Job#  [id number]

## 2013-11-15 NOTE — H&P (Signed)
Date of Surgery Update:  Nicole [name] was seen and examined.  History and physical has been reviewed. There have been no significant clinical changes since the completion of the originally dated History and Physical.  Patient identified by surgeon; surgical site was confirmed by patient and surgeon.    Signed By: Smith Mince, MD, MD     November 15, 2013 7:34 AM

## 2013-11-15 NOTE — Op Note (Signed)
Name:      [name], Nicole                                          Surgeon:        Annaka Cleaver M Wayburn Shaler, MD  Account #: [id number]                 Surgery Date:   11/15/2013  DOB:       06/22/1961  Age:       52                           Location:                                 OPERATIVE REPORT      PREOPERATIVE DIAGNOSIS: L5-S1 right foraminal stenosis.    POSTOPERATIVE DIAGNOSIS: L5-S1 right foraminal stenosis.    PROCEDURES PERFORMED: L5-S1 laminectomy and foraminotomy on the right-hand  side.    COMPLICATIONS: None.    ESTIMATED BLOOD LOSS: Minimal.    SURGEON:  Everli Rother, MD    ASSISTANT: Sean Little, Physician Assistant    ANESTHESIA: General endotracheal.    PREOPERATIVE ANTIBIOTICS: Two grams of Ancef.    INTRAOPERATIVE ANTIBIOTICS:  Vancomycin 500 mg sprinkled in the wound.    INDICATIONS FOR PROCEDURE: This is a 52-year-old woman who has had back and  right leg issues for an extended period of time, has had multiple  injections, therapies, MRI showing severe  foraminal stenosis and finally  elected to undergo surgical intervention, understands the risks and  benefits and wants to proceed.    DESCRIPTION OF PROCEDURE: The patient was taken to the operating room,  placed under general endotracheal anesthesia, prone position on the Wilson  frame. The back was prepped and draped in sterile manner. Incision was made  over L5-S1 level after taking an x-ray to confirm this level. Sharp  dissection through the skin about 4 cm in length, Bovie electrocautery and  Cobb elevation down to the spinous processes and then over the lamina to  the facet joints bilaterally. X-rays taken again with a curette down at the  L5-S1 level and found to be appropriately placed. We then place  self-retaining retractor, then stopped the bleeding with Bovie  electrocautery, do a laminectomy of the L5-S1 level undercutting the S1 on  the right-hand side and L5 and following the L5 nerve root all the way out  its neural foramen,  undercutting the facet joint.  At the time of getting  into this area, we did place a Woodson and a Murphy ball-tipped probe and  found that this foramen was exquisitely tight. We opened it up so that the  2-3 mm Murphy's ball fit with an extra millimeter of free space, stopped  bleeding with bipolar electrocautery. We undercut the facet joint fairly  dramatically but left at least half the joint intact. Following this, we  irrigated and then sprinkled in the vancomycin powder and closed the wound  in multiple layers, ending with a subcuticular and Steri-Strips. The  patient tolerated this well and went to the recovery room in satisfactory  condition.  We did inject the wound with 20 mg of Marcaine with epinephrine  0.25% to help with pain control.    SPECIMENS   REMOVED:  0          [name], MD    cc:   [name], MD        SMF/wmx; D: 11/15/2013 09:58 A; T: 11/15/2013 11:40 A; Doc# [id number]; Job#  [id number]

## 2013-11-15 NOTE — Anesthesia Pre-Procedure Evaluation (Addendum)
Anesthetic History   No history of anesthetic complications           Review of Systems / Medical History  Patient summary reviewed, nursing notes reviewed and pertinent labs reviewed    Pulmonary  Within defined limits               Neuro/Psych   Within defined limits           Cardiovascular    Hypertension                 GI/Hepatic/Renal     GERD             Endo/Other  Within defined limits           Other Findings            Physical Exam    Airway  Mallampati: II  TM Distance: 4 - 6 cm  Neck ROM: normal range of motion   Mouth opening: Normal     Cardiovascular    Rhythm: regular  Rate: normal         Dental  No notable dental hx       Pulmonary  Breath sounds clear to auscultation               Abdominal         Other Findings            Anesthetic Plan    ASA: 2  Anesthesia type: general            Anesthetic plan and risks discussed with: Patient

## 2013-11-15 NOTE — Brief Op Note (Signed)
BRIEF OPERATIVE NOTE    Date of Procedure: 11/15/2013   Preoperative Diagnosis: L5-S1 RIGHT FORAMINAL STENOSIS  Postoperative Diagnosis: L5-S1 RIGHT FORAMINAL STENOSIS    Procedure(s):  L5-S1 RIGHT FORAMINOTOMY  Surgeon(s) and Role:     * Smith Mince, MD - Primary         Haskel Schroeder, PA-C  Anesthesia: General   Estimated Blood Loss: nominal  Specimens: * No specimens in log *   Findings: L5-S1 Right foraminal stenosis   Complications: none  Implants: * No implants in log *

## 2013-11-15 NOTE — Other (Signed)
Preop assessment completed, celebrex given and type and screen sent to the lab for processing. PIV placed in left hand

## 2013-11-16 LAB — METABOLIC PANEL, BASIC
Anion gap: 10 mmol/L (ref 5–15)
BUN/Creatinine ratio: 23 — ABNORMAL HIGH (ref 12–20)
BUN: 12 MG/DL (ref 6–20)
CO2: 25 mmol/L (ref 21–32)
Calcium: 9 MG/DL (ref 8.5–10.1)
Chloride: 108 mmol/L (ref 97–108)
Creatinine: 0.52 MG/DL (ref 0.45–1.15)
GFR est AA: >60 mL/min/{1.73_m2} (ref >60)
GFR est non-AA: >60 mL/min/{1.73_m2} (ref >60)
Glucose: 105 mg/dL — ABNORMAL HIGH (ref 65–100)
Potassium: 3.5 mmol/L (ref 3.5–5.1)
Sodium: 143 mmol/L (ref 136–145)

## 2013-11-16 LAB — HEMOGLOBIN: HGB: 10.2 g/dL — ABNORMAL LOW (ref 11.5–16.0)

## 2013-11-16 MED ADMIN — pantoprazole (PROTONIX) tablet 40 mg: ORAL | @ 12:00:00 | NDC 62175018146

## 2013-11-16 MED ADMIN — 0.9% sodium chloride infusion: INTRAVENOUS | @ 08:00:00 | NDC 00409798309

## 2013-11-16 MED ADMIN — oxyCODONE IR (ROXICODONE) tablet 20 mg: ORAL | @ 14:00:00 | NDC 00406055223

## 2013-11-16 MED ADMIN — ceFAZolin in 0.9% NS (ANCEF) IVPB soln 2 g: INTRAVENOUS | @ 12:00:00 | NDC 09999966850

## 2013-11-16 MED ADMIN — HYDROmorphone (PF) (DILAUDID) injection 0.5 mg: INTRAVENOUS | @ 05:00:00 | NDC 00409255201

## 2013-11-16 MED ADMIN — losartan (COZAAR) tablet 50 mg: ORAL | @ 14:00:00 | NDC 68382013616

## 2013-11-16 MED ADMIN — oxyCODONE IR (ROXICODONE) tablet 15 mg: ORAL | @ 10:00:00 | NDC 00406055223

## 2013-11-16 MED ADMIN — sodium chloride (NS) flush 5-10 mL: INTRAVENOUS | @ 12:00:00 | NDC 87701099893

## 2013-11-16 MED ADMIN — methocarbamol (ROBAXIN) tablet 500 mg: ORAL | @ 03:00:00 | NDC 31722053301

## 2013-11-16 MED ADMIN — ceFAZolin in 0.9% NS (ANCEF) IVPB soln 2 g: INTRAVENOUS | @ 03:00:00 | NDC 09999966850

## 2013-11-16 MED ADMIN — HYDROmorphone (PF) (DILAUDID) injection 0.5 mg: INTRAVENOUS | @ 12:00:00 | NDC 00409255201

## 2013-11-16 MED ADMIN — methocarbamol (ROBAXIN) tablet 500 mg: ORAL | @ 14:00:00 | NDC 31722053301

## 2013-11-16 MED ADMIN — methocarbamol (ROBAXIN) tablet 500 mg: ORAL | @ 17:00:00 | NDC 31722053301

## 2013-11-16 MED ADMIN — oxyCODONE IR (ROXICODONE) tablet 15 mg: ORAL | @ 03:00:00 | NDC 00406055223

## 2013-11-16 MED ADMIN — diazepam (VALIUM) tablet 5 mg: ORAL | @ 05:00:00 | NDC 63739007310

## 2013-11-16 MED ADMIN — oxyCODONE IR (ROXICODONE) tablet 15 mg: ORAL | @ 07:00:00 | NDC 00406055223

## 2013-11-16 MED ADMIN — senna-docusate (PERICOLACE) 8.6-50 mg per tablet 1 tablet: ORAL | @ 14:00:00 | NDC 63739043201

## 2013-11-16 MED ADMIN — oxyCODONE IR (ROXICODONE) tablet 20 mg: ORAL | @ 17:00:00 | NDC 00406055223

## 2013-11-16 NOTE — Discharge Summary (Signed)
[facility]  [address]  [place], [place] [address]    DISCHARGE SUMMARY     Patient: Nicole [name]                             Medical Record Number: [id number]                DOB: 1961-07-17  Age: 52 y.o.  Admit Date: 11/15/2013  Discharge Date:   Admission Diagnosis: L5-S1 RIGHT FORAMINAL STENOSIS  L5-S1 RIGHT FORAMINAL STENOSIS  L5-S1 RIGHT FORAMINAL STENOSIS  Discharge Diagnosis: L5-S1 RIGHT FORAMINAL STENOSIS  Procedures: Procedure(s):  L5-S1 RIGHT FORAMINOTOMY  Surgeon: Dr. Smith Mince  Complications: None     History of Present Illness:  Nicole [name] is a 52 y.o. female with a history of back and leg pain.  Despite conservative management and after clinical and radiographic evaluation, it was determined that @she @ would benefit from Procedure(s):  L5-S1 RIGHT FORAMINOTOMY, which she consented to undergo after a discussion of the risks, benefits, alternatives, and potential complications to surgery.    Hospital Course:  Nicole [name] tolerated the procedure well.  She was transferred to the Orthopaedic floor from the recovery room in stable condition.  On postoperative day 1, the dressing was clean and dry, she was neurovascularly intact and afebrile, and her vital signs were stable.  On postoperative day 1, doing well.  Hemoglobin and INR prior to discharge were   Lab Results   Component Value Date/Time    HGB 10.2 11/16/2013  5:10 AM   . Nicole [name] made excellent progress with physical therapy and was discharged to Home in stable condition on postoperative day 1. She was given routine postoperative instructions and advised to follow up in Dr. Gregor Hams office in 3 weeks following discharge.      Discharge Medications:  Current Discharge Medication List      START taking these medications    Details   oxyCODONE IR (ROXICODONE) 10 mg tab immediate release tablet Take 2 tablets by mouth every four (4) hours as needed for Pain.  Qty: 120 tablet, Refills: 0          CONTINUE these medications which have NOT CHANGED    Details   diazepam (VALIUM) 5 mg tablet Take 5 mg by mouth every six (6) hours as needed for Anxiety.      losartan (COZAAR) 50 mg tablet Take 50 mg by mouth daily.      multivitamin (ONE A DAY) tablet Take 1 tablet by mouth daily.      vitamin c-vitamin e (CRANBERRY CONCENTRATE) cap Take  by mouth.      esomeprazole (NEXIUM) 20 mg capsule Take  by mouth daily.      !! methocarbamol (ROBAXIN) 500 mg tablet       !! methocarbamol (ROBAXIN) 500 mg tablet Take 1 tablet by mouth four (4) times daily.  Qty: 15 tablet, Refills: 0       !! - Potential duplicate medications found. Please discuss with provider.      STOP taking these medications       acetaminophen (TYLENOL EXTRA STRENGTH) 500 mg tablet Comments:   Reason for Stopping:         HYDROcodone-acetaminophen (NORCO) 10-325 mg tablet Comments:   Reason for Stopping:         HYDROcodone-acetaminophen (NORCO) 10-325 mg tablet Comments:   Reason for Stopping:  Signed by: Smith Mince, MD, MD  11/16/2013

## 2013-11-16 NOTE — Progress Notes (Signed)
Pt given and gone over rx and discharge instructions. PIV removed opportunity to ask questions was given

## 2013-11-16 NOTE — Progress Notes (Addendum)
Problem: Mobility Impaired (Adult and Pediatric)  Goal: *Acute Goals and Plan of Care (Insert Text)  Physical Therapy Goals  Initiated 11/16/2013    1. Patient will move from supine to sit and sit to supine in bed with modified independence within 7 days.  2. Patient will ambulate with modified independence for 300 feet with the least restrictive device within 4 days.  3. Patient will ascend/descend 1 stairs without handrail(s) with modified independence within 7 days.  4. Patient will verbalize and demonstrate understanding of spinal precautions (No bending, lifting greater than 5 lbs, or twisting; log-roll technique; frequent repositioning as instructed) within 7 days.  PHYSICAL THERAPY EVALUATION INCLUDING A FUNCTIONAL MEASURE  Patient: Nicole [name] (52 y.o. female)  Date: 11/16/2013  Primary Diagnosis: L5-S1 RIGHT FORAMINAL STENOSIS  L5-S1 RIGHT FORAMINAL STENOSIS  L5-S1 RIGHT FORAMINAL STENOSIS  Procedure(s) (LRB):  L5-S1 RIGHT FORAMINOTOMY (N/A) 1 Day Post-Op   Precautions:   Back;Fall      ASSESSMENT :  Based on the objective data described below, the patient presents with decreased right lower extremity strength, right lower extremity pain with MMT, 1 instance of right lower extremity instability during ambulation, and limited functional mobility following L5-S1 right foraminotomy. She is at moderate risk of falling per objective measure and pt reports deviations consistent with status prior to admission. Pt reports ambulating with use of rolling walker and occasional instance of right leg instability prior to admission. She ambulates 120' with SBA and one instance of minimal assistance without assistive device. Pt educated regarding spinal precautions and log roll technique. Recommend rolling walker use for improved stability, HHPT, and 24-hour assistance at discharge. Pt verbalizes understanding regarding PT recommendations and education.    Patient will benefit from skilled intervention to address the  above impairments.  Patient???s rehabilitation potential is considered to be Good  Factors which may influence rehabilitation potential include:   [X]          None noted  [ ]          Mental ability/status  [ ]          Medical condition  [ ]          Home/family situation and support systems  [ ]          Safety awareness  [ ]          Pain tolerance/management  [ ]          Other:        PLAN :  Recommendations and Planned Interventions:  [X]            Bed Mobility Training             [ ]     Neuromuscular Re-Education  [X]            Transfer Training                   [ ]     Orthotic/Prosthetic Training  [X]            Gait Training                         [ ]     Modalities  [X]            Therapeutic Exercises           [ ]     Edema Management/Control  [X]            Therapeutic Activities            [  X]    Patient and Family Training/Education  [ ]            Other (comment):    Frequency/Duration: Patient will be followed by physical therapy twice daily to address goals.  Discharge Recommendations: Home Health  Further Equipment Recommendations for Discharge: none       SUBJECTIVE:   Patient stated ???That happens sometimes at home and I just grab onto something,??? re: right leg instability. Pt received ambulating independently in room, attempting to pick objects up from floor and packing belongings.       OBJECTIVE DATA SUMMARY:       Past Medical History   Diagnosis Date   ??? Pinched nerve     ??? Ill-defined condition         depression   ??? Hypertension     ??? GERD (gastroesophageal reflux disease)     ??? Chronic pain       Past Surgical History   Procedure Laterality Date   ??? Pr abdomen surgery proc unlisted   05-2013       hernia   ??? Hx gyn           hysterectomy   ??? Hx cesarean section           x 3   ??? Hx hysterectomy         Prior Level of Function/Home Situation: pt reports living alone; states friend/neighbor will provide care at discharge.   Home Situation  Home Environment: Private residence  # Steps to Enter: 1  Rails  to Enter: No  One/Two Story Residence: One story  Living Alone: Yes   Support Systems: Friends \\ neighbors  Patient Expects to be Discharged to:: Private residence  Current DME Used/Available at Home: Environmental consultant, rolling  Critical Behavior:  Neurologic State: Alert  Orientation Level: Oriented X4  Cognition: Follows commands     Skin:  Dressing intact without visible drainage. No brace issued.  Strength:         WFL left lower extremity; right lower extremity knee flexion and ankle dorsiflexion generally decreased with MMT (3+/5)                  Tone & Sensation:       normal and intact throughout lower extremities                           Range Of Motion:      AROM WFL lower extremities                    Coordination:   WFL    Functional Mobility:                  Transfers:  Sit to Stand: Modified independence, requires equipment  Stand to Sit: Modified independence, requires equipment        Bed to Chair: Modified independence, requires equipment              Balance:   Sitting: Intact  Standing: Without support  Standing - Static: Good  Standing - Dynamic : Good  Ambulation/Gait Training:  Distance (ft): 120 Feet (ft)  Assistive Device: Gait belt  Ambulation - Level of Assistance: SBA (1 instance of cga/min assist for balance recovery)        Gait Abnormalities: Path deviations        Base of Support: Widened     Speed/Cadence: Fluctuations .  Pt with slow pace and reaching for objects for support at times, at other times ambulating with faster pace, reaching outside base of support to open doors and performing normal-paced turns and direction changes without loss of balance or complaints.                                 1 loss of balance to right/leg instability on right after ambulating 100' with SBA; pt reaches for support and offered CGA/min assistance for balance recover.    Functional Measure:  Tinetti test:      Sitting Balance: 1  Arises: 1  Attempts to Rise: 2  Immediate Standing Balance: 2  Standing  Balance: 2  Nudged: 2  Eyes Closed: 1  Turn 360 Degrees - Continuous/Discontinuous: 1  Turn 360 Degrees - Steady/Unsteady: 1  Sitting Down: 1  Balance Score: 14  Indication of Gait: 1  R Step Length/Height: 1  L Step Length/Height: 1  R Foot Clearance: 1  L Foot Clearance: 1  Step Symmetry: 1  Step Continuity: 1  Path: 0  Trunk: 2  Walking Time: 1  Gait Score: 10  Total Score: 24        Tinetti Test and G-code impairment scale:  Percentage of Impairment CH    0%    CI    1-19% CJ    20-39% CK    40-59% CL    60-79% CM    80-99% CN     100%   Tinetti  Score 0-28 28 23-27 17-22 12-16 6-11 1-5 0         Tinetti Tool Score Risk of Falls  <19 = High Fall Risk  19-24 = Moderate Fall Risk  25-28 = Low Fall Risk  Tinetti ME. Performance-Oriented Assessment of Mobility Problems in Elderly Patients. JAGS 1986; [id number]. (Scoring Description: PT Bulletin Feb. 10, 1993)    Older adults: Lonn Georgia et al, 2009; n = 1000 [place] elderly evaluated with ABC, POMA, ADL, and IADL)  ?? Mean POMA score for males aged 65-79 years = 26.21(3.40)  ?? Mean POMA score for females age 73-79 years = 25.16(4.30)  ?? Mean POMA score for males over 80 years = 23.29(6.02)  ?? Mean POMA score for females over 80 years = 17.20(8.32)        Reviewed spinal precautions with demonstration, especially related to bending and picking up objects from floor. Advised pt sit during dressing tasks for optimal balance/stability, and obtain reacher. Reinforcement and further instruction deferred to OT.        Pain:  Pain Scale 1: Visual  Pain Intensity 1: 0  Pain Location 1: Back  Pain Orientation 1: Lower  Pain Description 1: Aching  Pain Intervention(s) 1: Medication (see MAR)  Activity Tolerance:   No complaints or signs/symptoms of distress.  Please refer to the flowsheet for vital signs taken during this treatment.  After treatment:   [X]          Patient left in no apparent distress sitting up in chair  [ ]          Patient left in no apparent distress in bed  [X]           Call bell left within reach  [X]          Nursing notified; discussed pt presentation/performance, PT recommendations, and brace order with RN. Also discussed brace order with ortho PA.  [X]   Caregiver present (OT)  [ ]          Bed alarm activated      COMMUNICATION/EDUCATION:   The patient???s plan of care was discussed with: Occupational Therapist and Registered Nurse.  [X]          Fall prevention education was provided and the patient/caregiver indicated understanding.  [X]          Patient/family have participated as able in goal setting and plan of care.  [X]          Patient/family agree to work toward stated goals and plan of care.  [ ]          Patient understands intent and goals of therapy, but is neutral about his/her participation.  [ ]          Patient is unable to participate in goal setting and plan of care.    Thank you for this referral.  Juliann Pares, PT, DPT   Time Calculation: 25 mins

## 2013-11-16 NOTE — Progress Notes (Signed)
Verbal shift change report given to Neurosurgeon (oncoming nurse) by Yvone Neu (offgoing nurse). Report included the following information SBAR, Kardex, Procedure Summary, Intake/Output, MAR and Recent Results.

## 2013-11-16 NOTE — Progress Notes (Signed)
Problem: Self Care Deficits Care Plan (Adult)  Goal: *Acute Goals and Plan of Care (Insert Text)  Occupational Therapy Goals  Initiated 11/16/2013    1. Patient will perform lower body dressing with modified independence using Reacher PRN within 7 days.  2. Patient will perform toilet transfer with modified independence using most appropriate DME within 7 days.   3. Patient will toileting at modified independence within 7 days.   4. Patient will don/doff back brace at modified independence within 7 days.  5. Patient will verbalize/demonstrate 3/3 back precautions during ADL tasks when given verbal cues within 7 days.   OCCUPATIONAL THERAPY EVALUATION  Patient: Nicole [name] (52 y.o. female)  Date: 11/16/2013  Primary Diagnosis: L5-S1 RIGHT FORAMINAL STENOSIS  L5-S1 RIGHT FORAMINAL STENOSIS  L5-S1 RIGHT FORAMINAL STENOSIS  Procedure(s) (LRB):  L5-S1 RIGHT FORAMINOTOMY (N/A) 1 Day Post-Op   Precautions:   Back;Fall      ASSESSMENT :  Based on the objective data described below, the patient presents with s/p back surgery, currently min A to Mod I for ADLs with assist for LB dressing/bathing only due to fair functional reach due to back precautions. Educated on back precautions with ADLs, AE for LB dressing, energy conservation, and activity medication, patient quick to complete tasks verbal cues for safety required, encouraged to let friend to assist at home, [facility].     Patient will benefit from skilled intervention to address the above impairments.  Patient???s rehabilitation potential is considered to be Good  Factors which may influence rehabilitation potential include:   [X]              None noted  [ ]              Mental ability/status  [ ]              Medical condition  [ ]              Home/family situation and support systems  [ ]              Safety awareness  [ ]              Pain tolerance/management  [ ]              Other:        PLAN :  Recommendations and Planned Interventions:  [X]                 Self Care Training                  [X]         Therapeutic Activities  [ ]                Functional Mobility Training    [ ]         Cognitive Retraining  [X]                Therapeutic Exercises           [X]         Endurance Activities  [ ]                Balance Training                   [ ]         Neuromuscular Re-Education  [ ]                Visual/Perceptual Training     [X]    Home Safety Training  [X]   Patient Education                 [ ]         Family Training/Education  [ ]                Other (comment):    Frequency/Duration: Patient will be followed by occupational therapy 5 times a week to address goals.  Discharge Recommendations: Home Health  Further Equipment Recommendations for Discharge: RW owns at home, reacher for LB dressing       SUBJECTIVE:   Patient stated ???Oh I want to get back to work soon.???      OBJECTIVE DATA SUMMARY:       Past Medical History   Diagnosis Date   ??? Pinched nerve     ??? Ill-defined condition         depression   ??? Hypertension     ??? GERD (gastroesophageal reflux disease)     ??? Chronic pain       Past Surgical History   Procedure Laterality Date   ??? Pr abdomen surgery proc unlisted   05-2013       hernia   ??? Hx gyn           hysterectomy   ??? Hx cesarean section           x 3   ??? Hx hysterectomy         Prior Level of Function/Home Situation: lives alone, friend to assist at discharge, was Mod I to I for all ADLs and functional mobility, working as Financial planner  Home Environment: Private residence  # Steps to Enter: 1  Rails to [employer]: No  One/Two Story Residence: One story  Living Alone: Yes   Support Systems: Friends \\ neighbors  Patient Expects to be Discharged to:: Private residence  Current DME Used/Available at Home: Walker, rolling  [ ]   Right hand dominant   [ ]   Left hand dominant NT  Cognitive/Behavioral Status:  Neurologic State: Alert  Orientation Level: Oriented X4  Cognition: Follows commands;Appropriate decision making  Perception:  Appears intact  Perseveration: No perseveration noted  Safety/Judgement: Awareness of environment  Skin: intact, no drainage noted  Edema: none noted  Vision/Perceptual:                           Acuity: Within Defined Limits    Corrective Lenses: Reading glasses  Coordination:  Coordination: Within functional limits  Fine Motor Skills-Upper: Left Intact;Right Intact       Balance:  Sitting: Intact  Standing: Intact  Standing - Static: Good  Standing - Dynamic : Good  Strength:  B UE   Strength: Generally decreased, functional              Tone & Sensation:  B UE   Tone: Normal  Sensation: Intact                    Range of Motion:  B UE   AROM: Within functional limits                       Functional Mobility and Transfers for ADLs:  Bed Mobility:   up in chair upon arrival, verbal education on log roll and bed mobility safety technique           Transfers:  Sit to Stand: Modified independence, requires equipment  Bed to Chair: Modified independence, requires equipment                                Toilet Transfer : Modified independent                                               ADL Assessment:  Feeding: Modified independent    Oral Facial Hygiene/Grooming: Modified Independent    Bathing: Minimum assistance    Upper Body Dressing: Modified independent    Lower Body Dressing: Minimum assistance    Toileting: Modified independent              ADL Intervention:     Completed OT evaluation and ADLs seated EOB and standing as able with RW and without RW for balance. Educated on safety and endurance training with encouragement for full participation in ADLs while in hospital. Good understanding noted.     Patient instructed and indicated understanding home modifications (ie raise height of ADL objects, appropriate chair heights, recliner safety), safety (ie change of floor surfaces, clear pathways), to increase independence and fall prevention withRW    No bending, no lifting greater than 5 lbs, no twisting,  log-roll technique, repositioning every 20-30 min except when sleeping, brace when OOB       Educated on tub transfer, therapist demonstrated safe side step technique over tub edge while maintaining precautions. Completed with B UE on shower wall for stability and safety. Educated on safety precautions to prevent fall while in shower. Patient with good understanding of this technique.     Patient educated on use of adaptive equipment (ie. Reacher, sock aid, long shoe horn, long handled sponge, and dressing stick) in order to complete LB dressing. Patient instructed on technique to complete. Patient indicated understanding/recalled strategies with verbal cues.                                 Cognitive Retraining  Safety/Judgement: Awareness of environment    Therapeutic Exercise:  encouraged increased activity as tolerated and OOB    Pain:  Pain Scale 1: Visual  Pain Intensity 1: 0  Pain Location 1: Back  Pain Orientation 1: Lower  Pain Description 1: Aching  Pain Intervention(s) 1: Medication (see MAR)  Activity Tolerance:   good  Please refer to the flowsheet for vital signs taken during this treatment.  After treatment:   [X]  Patient left in no apparent distress sitting up in chair  [ ]  Patient left in no apparent distress in bed  [X]  Call bell left within reach  [X]  Nursing notified  [ ]  Caregiver present  [ ]  Bed alarm activated      COMMUNICATION/EDUCATION:   The patient???s plan of care was discussed with: Physical Therapist and Registered Nurse.  [X]  Home safety education was provided and the patient/caregiver indicated understanding.  [X]  Patient/family have participated as able in goal setting and plan of care.  [X]  Patient/family agree to work toward stated goals and plan of care.  [ ]  Patient understands intent and goals of therapy, but is neutral about his/her participation.  [ ]  Patient is unable to participate in goal setting and plan of care.  This patient???s plan of care is appropriate for delegation  to  OTA.    Thank you for this referral.  Jessica T. Hunsucker, OT  Time Calculation: 26 mins

## 2013-11-16 NOTE — Progress Notes (Addendum)
7:45 AM    I met with the pt to assess discharge needs. Pt lives alone in a single story house with no steps at the entry way. Pt was independent in her ADLs and currently does not use any DME. Pt is in the process of changing her PCP and was not able to confirm the name of her new PCP. Pt has prescription coverage through her insurance and [facility]. Pt has had home health before but does not remember the name of the home health agency. I provided the freedom of choice letter for home health agencies and pt verbalized that she does not have any preferences.    11:53 AM   Referral via [facility] sent to [facility] and received confirmation that they are willing to accept the pt.      Care Management Interventions  Care Management Consult: Yes  Adequate Support Network: Lives Alone  Plan discussed with patient?: Yes  Pt Discharge Location  Living Situation: Home with home health    Elenora Fender RN

## 2013-11-16 NOTE — Progress Notes (Signed)
Spiritual Care Assessment/Progress Notes    Nicole [name] [id number]  [id number]    [birth year]  52 y.o.  female    Patient Telephone Number: [phone] (home)   Religious Affiliation: [employer]   Language: English   Extended Emergency Contact Information  Primary Emergency Contact: [facility] [place]  Home Phone: [phone]  Mobile Phone: [phone]  Relation: Other Relative   There are no active problems to display for this patient.       Date: 11/16/2013       Level of Religious/Spiritual Activity:  [x]          Involved in faith tradition/spiritual practice    []          Not involved in faith tradition/spiritual practice  [x]          Spiritually oriented    []          Claims no spiritual orientation    []          seeking spiritual identity  []          Feels alienated from religious practice/tradition  []          Feels angry about religious practice/tradition  [x]          Spirituality/religious tradition is a Theatre stage manager for coping at this time.  []          Not able to assess due to medical condition    Services Provided Today:  []          crisis intervention    []          reading Scriptures  []          spiritual assessment    [x]          prayer  []          empathic listening/emotional support  []          rites and rituals (cite in comments)  []          life review     []          religious support  []          theological development   []          advocacy  []          ethical dialog     []          blessing  []          bereavement support    []          support to family  []          anticipatory grief support   []          help with AMD  []          spiritual guidance    []          meditation      Spiritual Care Needs  []          Emotional Support  [x]          Spiritual/Religious Care  []          Loss/Adjustment  []          Advocacy/Referral /Ethics  []          No needs expressed at this time  []          Other: (note in comments)  Spiritual Care Plan  []          Follow up visits with pt/family  []           Provide materials  []   Schedule sacraments  []          Contact [facility]  [x]          Follow up as needed  []          Other: (note in comments)     Comments: Provided pastoral support and prayer for patient. Affirmed availability of chaplain's ongoing support as needed.   Clancy Gourd, Chaplain, MDiv, MS  [phone] PRAY [phone])

## 2013-11-20 NOTE — ED Notes (Signed)
Pt states she has back pain, hasd surgery a few go on her back. States she "keeps falling." Pt with slurred speech and appears medicated.

## 2013-11-20 NOTE — ED Notes (Signed)
I have reviewed discharge instructions with the patient.  The patient verbalized understanding.

## 2013-11-20 NOTE — ED Provider Notes (Signed)
HPI Comments: Patient presents with complaints of lower back pain. Patient reports having surgery on 12/2. Patient was started on neurontin on Wednesday and has had been feeling more off balance. Patient denies bowel or bladder incontinence or saddle anesthesia.    Patient is a 52 y.o. female presenting with back pain and bleeding. The history is provided by the patient.   Back Pain   This is a recurrent problem. The current episode started more than 1 week ago. The problem has not changed since onset.The problem occurs constantly. Patient reports not work related injury.The pain is associated with no known injury. The pain is present in the lumbar spine. The quality of the pain is described as stabbing, shooting and aching. The pain is at a severity of 7/10. The pain is severe. The pain is the same all the time. She has tried NSAIDs and analgesics for the symptoms. The treatment provided no relief. The patient's surgical history includes laminectomy.  Post-Op Problem         Past Medical History   Diagnosis Date   ??? Pinched nerve    ??? Ill-defined condition      depression   ??? Hypertension    ??? GERD (gastroesophageal reflux disease)    ??? Chronic pain         Past Surgical History   Procedure Laterality Date   ??? Pr abdomen surgery proc unlisted  05-2013     hernia   ??? Hx gyn       hysterectomy   ??? Hx cesarean section       x 3   ??? Hx hysterectomy           Family History   Problem Relation Age of Onset   ??? Diabetes Mother    ??? Stroke Mother    ??? Heart Disease Mother    ??? Cancer Father    ??? Hypertension Sister         History     Social History   ??? Marital Status: LEGALLY SEPARATED     Spouse Name: N/A     Number of Children: N/A   ??? Years of Education: N/A     Occupational History   ??? Not on file.     Social History Main Topics   ??? Smoking status: Current Every Day Smoker -- 0.25 packs/day for 25 years   ??? Smokeless tobacco: Never Used   ??? Alcohol Use: No   ??? Drug Use: No   ??? Sexually Active: Not on file     Other  Topics Concern   ??? Not on file     Social History Narrative    ** Merged History Encounter **                       ALLERGIES: Flexeril and Tramadol      Review of Systems   Constitutional: Negative.    HENT: Negative.    Eyes: Negative.    Respiratory: Negative.    Cardiovascular: Negative.    Gastrointestinal: Negative.    Endocrine: Negative.    Genitourinary: Negative.    Musculoskeletal: Positive for back pain.   Skin: Negative.    Allergic/Immunologic: Negative.    Neurological: Negative.    Hematological: Negative.    Psychiatric/Behavioral: Negative.    All other systems reviewed and are negative.        Filed Vitals:    11/20/13 1949   BP: 129/80   Pulse:  95   Temp: 97.8 ??F (36.6 ??C)   Resp: 18   Height: 5\' 1"  (1.549 m)   Weight: 77.111 kg (170 lb)   SpO2: 97%            Physical Exam   Nursing note and vitals reviewed.  Constitutional: She is oriented to person, place, and time. She appears well-developed and well-nourished.   HENT:   Head: Normocephalic and atraumatic.   Right Ear: External ear normal.   Left Ear: External ear normal.   Mouth/Throat: Oropharynx is clear and moist. No oropharyngeal exudate.   Eyes: Conjunctivae and EOM are normal. Pupils are equal, round, and reactive to light. Right eye exhibits no discharge. Left eye exhibits no discharge. No scleral icterus.   Neck: Normal range of motion. No tracheal deviation present. No thyromegaly present.   Cardiovascular: Normal rate, regular rhythm and normal heart sounds.    No murmur heard.  Pulmonary/Chest: Effort normal and breath sounds normal. No respiratory distress. She has no wheezes. She has no rales. She exhibits no tenderness.   Abdominal: Soft. Bowel sounds are normal. She exhibits no distension. There is no tenderness. There is no rebound and no guarding.   Musculoskeletal: Normal range of motion. She exhibits no edema and no tenderness.        Back:         Legs:  Lymphadenopathy:     She has no cervical adenopathy.   Neurological:  She is alert and oriented to person, place, and time. She has normal strength. No cranial nerve deficit or sensory deficit. She displays a negative Romberg sign. Coordination normal.   Skin: Skin is warm. No erythema.   Psychiatric: She has a normal mood and affect. Her behavior is normal. Judgment and thought content normal.        MDM    Procedures

## 2013-11-20 NOTE — ED Provider Notes (Signed)
I have personally seen and evaluated patient. I find the patient's history and physical exam are consistent with the PA's NP documentation. I agree with the care provided, treatments rendered, disposition and follow up plan. Over 100 tabs of recent oxycontin rx missing, discussed only can give single dose here and to contact Dr. Luciana Axe for refill

## 2013-11-20 NOTE — ED Notes (Signed)
Pt now states dropped herpain  medications Thursday down the sink.

## 2013-11-21 MED ADMIN — HYDROcodone-acetaminophen (NORCO) 10-325 mg tablet 1 tablet: ORAL | @ 01:00:00 | NDC 53746011001

## 2013-11-21 MED ADMIN — ketorolac (TORADOL) injection 60 mg: INTRAMUSCULAR | @ 01:00:00 | NDC 00409379501

## 2013-11-21 MED ADMIN — morphine injection 4 mg: SUBCUTANEOUS | @ 01:00:00 | NDC 76045000511

## 2014-03-29 MED ORDER — DICLOFENAC 75 MG TAB, DELAYED RELEASE
75 mg | ORAL_TABLET | Freq: Two times a day (BID) | ORAL | Status: AC
Start: 2014-03-29 — End: ?

## 2014-03-29 MED ORDER — HYDROCODONE-ACETAMINOPHEN 7.5 MG-325 MG TAB
ORAL_TABLET | Freq: Every day | ORAL | Status: AC | PRN
Start: 2014-03-29 — End: ?

## 2014-03-29 NOTE — Progress Notes (Signed)
Chief Complaint   Patient presents with   ??? Establish Care   ??? Back Pain     pain since 2009--pain right lower back radiates down leg   ??? Hypertension     Patient is tearful during rooming process.    Reviewed record  In preparation for visit and have obtained necessary documentation.   [facility] during 2014.  Also, [facility]    Recently moved here from [place].  Works for [employer] X [employer] yrs.    POA and living will on file?  no

## 2014-03-29 NOTE — Progress Notes (Signed)
HISTORY OF PRESENT ILLNESS  Nicole [name] is a 53 y.o. female. This patient presents today to establish care.  The patient has complaints of severe lower back pain.  The patient has had L5-S1 right foraminotomy with Dr. Smith MinceSteven Fiore in December 2014.  Patient states surgery did not seem to help pain much.  She has been seeing pain specialist, prescribed oxycodone 10 mg PRN.  Patient states she has not been taking oxycodone, as she does not like how it makes her feel.  Patient states hydrocodone-APAP worked well in the past, and she is requesting refill of this, as well as valium for muscle spasm.  The patient describes the pain as severe and aching.  She states it radiates to her right leg.  She denies difficulty voiding.  Patient works as a Production assistant, radioserver at [employer] and is tearful, stating that pain is affecting her ability to work.   Patient denies any other problems at the time of today's visit including chest pain, shortness of breath, dizziness, and GI/GU issues.  Visit Vitals   Item Reading   ??? BP 149/82   ??? Pulse 78   ??? Temp(Src) 98.8 ??F (37.1 ??C) (Oral)   ??? Resp 20   ??? Ht 5' 3.5" (1.613 m)   ??? Wt 170 lb (77.111 kg)   ??? BMI 29.64 kg/m2   ??? SpO2 100%     HPI    Review of Systems   Constitutional: Negative for fever, chills, weight loss and malaise/fatigue.   HENT: Negative for congestion, ear pain and sore throat.    Eyes: Negative for blurred vision.   Respiratory: Negative for cough, shortness of breath and wheezing.    Cardiovascular: Negative for chest pain, palpitations and leg swelling.   Gastrointestinal: Negative for abdominal pain.   Genitourinary: Negative.    Musculoskeletal: Positive for back pain.   Skin: Negative.    Neurological: Negative for dizziness, tingling, tremors, weakness and headaches.   Endo/Heme/Allergies: Negative.    Psychiatric/Behavioral: Negative.        Physical Exam   Constitutional: She is oriented to person, place, and time. She appears well-developed and well-nourished. No  distress.   HENT:   Head: Normocephalic and atraumatic.   Right Ear: External ear normal.   Left Ear: External ear normal.   Nose: Nose normal.   Mouth/Throat: Oropharynx is clear and moist.   Eyes: Conjunctivae are normal.   Neck: Normal range of motion. Neck supple.   Cardiovascular: Normal rate, regular rhythm, normal heart sounds and intact distal pulses.    No murmur heard.  Pulmonary/Chest: Effort normal and breath sounds normal. No respiratory distress. She has no wheezes. She has no rales. She exhibits no tenderness.   Abdominal: Soft. Bowel sounds are normal. She exhibits no distension.   Musculoskeletal: Normal range of motion. She exhibits tenderness. She exhibits no edema.   Tenderness to right lower back  Right SLR positive   Neurological: She is alert and oriented to person, place, and time. No cranial nerve deficit. She exhibits normal muscle tone. Coordination normal.   Skin: Skin is warm and dry.   Psychiatric: She has a normal mood and affect. Her behavior is normal.   Nursing note and vitals reviewed.      ASSESSMENT and PLAN    ICD-9-CM    1. Right-sided low back pain with right-sided sciatica 724.3 Will order  diclofenac EC (VOLTAREN) 75 mg EC tablet, 1 tab po bid PRN pain.  Do not take in  combination with other NSAIDs, such as ibuprofen or Aleve.     Will order  HYDROcodone-acetaminophen (NORCO) 7.5-325 mg per tablet, 1 tab po every day PRN severe pain, #30, 0 refills.  Discussed medication and possible side effects in detail.  Patient states she has discarded Oxycodone as prescribed by previous pain specialist.  She states the pain specialist referred her elsewhere for care and she will not be following there any longer.    Discussed with patient the importance of receiving pain management and care from a single provider.  Discussed with patient referral to another pain specialist for any further pain management.       Will order  REFERRAL TO PAIN CLINIC   2. Elevated BP 796.2 Will monitor at  next visit in one month. DASH diet.     Lab results and schedule of future lab studies reviewed with patient  Reviewed diet, exercise and weight control  Very strongly urged to quit smoking to reduce cardiovascular risk  Reviewed medications and side effects in detail  Patient encouraged to call or return to office if symptoms do not improve or worsen.  Reviewed plan of care with patient who acknowledges understanding and agrees.   Follow-up in one month or sooner as needed.

## 2014-03-29 NOTE — Patient Instructions (Signed)
Sciatica: After Your Visit  Your Care Instructions     Sciatica (say "sye-AT-ih-kuh") is an irritation of one of the sciatic nerves, which come from the spinal cord in the lower back. The sciatic nerves and their branches extend down through the buttock to the foot. Sciatica can develop when an injured disc in the back presses against a spinal nerve root. Its main symptom is pain, numbness, or weakness that is often worse in the leg or foot than in the back.  Sciatica often will improve and go away with time. Early treatment usually includes medicines and exercises to relieve pain.  Follow-up care is a key part of your treatment and safety. Be sure to make and go to all appointments, and call your doctor if you are having problems. It's also a good idea to know your test results and keep a list of the medicines you take.  How can you care for yourself at home?  ?? Take pain medicines exactly as directed.  ?? If the doctor gave you a prescription medicine for pain, take it as prescribed.  ?? If you are not taking a prescription pain medicine, ask your doctor if you can take an over-the-counter medicine.  ?? Put ice or a cold pack on the middle of your lower back for 10 to 20 minutes several times each day. Put a thin cloth between the ice and your skin.  ?? After the first 2 or 3 days, use a warm pack or heating pad for 20 minutes at a time. Hot showers will also help. Hot baths may help as long as you can lie or sit in a position that does not stress your back. You may also keep using ice if it helps.  ?? Avoid sitting if possible, unless it feels better than standing.  ?? Alternate lying down with short walks. Increase your walking distance as you are able to without making your symptoms worse.  ?? Do not do anything that makes your symptoms worse.  When should you call for help?  Call 911 anytime you think you may need emergency care. For example, call if:  ?? You have sudden weakness or numbness in both legs.  ?? You  suddenly cannot walk or stand.  Call your doctor now or seek immediate medical care if:  ?? You have a new loss of bowel or bladder control.  ?? You have weakness in your ankle or leg.  ?? You have new pain, numbness, tingling, or weakness, especially in the buttocks, genital or rectal area, legs, or feet.  ?? You have symptoms of a urinary infection. For example:  ?? You have blood or pus in your urine.  ?? You have pain in your back just below your rib cage. This is called flank pain.  ?? You have a fever, chills, or body aches.  ?? It hurts to urinate.  ?? You have groin or belly pain.  Watch closely for changes in your health, and be sure to contact your doctor if:  ?? Your back pain gets worse or more frequent.  ?? Your back pain is not getting better after 1 week of home treatment. It may take a lot longer for the pain to go away completely, but it should feel at least a little better.   Where can you learn more?   Go to http://www.healthwise.net/[facility]  Enter Z239 in the search box to learn more about "Sciatica: After Your Visit."   ?? 2006-2015 Health[employer]. Care   instructions adapted under license by [employer] (which disclaims liability or warranty for this information). This care instruction is for use with your licensed healthcare professional. If you have questions about a medical condition or this instruction, always ask your healthcare professional. Healthwise, Incorporated disclaims any warranty or liability for your use of this information.  Content Version: 10.4.390249; Current as of: October 28, 2013

## 2015-09-14 ENCOUNTER — Emergency Department (HOSPITAL_COMMUNITY): Payer: Managed Care, Other (non HMO)

## 2015-09-14 ENCOUNTER — Emergency Department (HOSPITAL_COMMUNITY)
Admission: EM | Admit: 2015-09-14 | Discharge: 2015-09-14 | Disposition: A | Discharge status: Home / Self Care | Payer: Managed Care, Other (non HMO) | Attending: Emergency Medicine | Admitting: Emergency Medicine

## 2015-09-14 ENCOUNTER — Encounter (HOSPITAL_COMMUNITY): Payer: Self-pay | Admitting: Emergency Medicine

## 2015-09-14 DIAGNOSIS — S4991XA Unspecified injury of right shoulder and upper arm, initial encounter: Secondary | ICD-10-CM | POA: Diagnosis not present

## 2015-09-14 DIAGNOSIS — M25511 Pain in right shoulder: Secondary | ICD-10-CM

## 2015-09-14 DIAGNOSIS — Y9289 Other specified places as the place of occurrence of the external cause: Secondary | ICD-10-CM | POA: Insufficient documentation

## 2015-09-14 DIAGNOSIS — Z720 Tobacco use: Secondary | ICD-10-CM | POA: Diagnosis not present

## 2015-09-14 DIAGNOSIS — T1490XA Injury, unspecified, initial encounter: Secondary | ICD-10-CM

## 2015-09-14 DIAGNOSIS — Y998 Other external cause status: Secondary | ICD-10-CM | POA: Insufficient documentation

## 2015-09-14 DIAGNOSIS — X58XXXA Exposure to other specified factors, initial encounter: Secondary | ICD-10-CM | POA: Insufficient documentation

## 2015-09-14 DIAGNOSIS — Y9389 Activity, other specified: Secondary | ICD-10-CM | POA: Insufficient documentation

## 2015-09-14 MED ORDER — METHOCARBAMOL 500 MG PO TABS
1000.0000 mg | ORAL_TABLET | Freq: Once | ORAL | Status: AC
  Administered 2015-09-14: 1000 mg via ORAL
  Filled 2015-09-14: qty 2

## 2015-09-14 MED ORDER — METHOCARBAMOL 500 MG PO TABS: 500.0000 mg | ORAL_TABLET | Freq: Four times a day (QID) | ORAL | Status: DC | PRN

## 2015-09-14 NOTE — Discharge Instructions (Signed)
Read the information below.  Use the prescribed medication as directed.  Please discuss all new medications with your pharmacist.  You may return to the Emergency Department at any time for worsening condition or any new symptoms that concern you.  If you develop uncontrolled pain, weakness or numbness of the extremity, severe discoloration of the skin, or you are unable to move your right arm, return to the ER for a recheck.       Shoulder Pain The shoulder is the joint that connects your arms to your body. The bones that form the shoulder joint include the upper arm bone (humerus), the shoulder blade (scapula), and the collarbone (clavicle). The top of the humerus is shaped like a ball and fits into a rather flat socket on the scapula (glenoid cavity). A combination of muscles and strong, fibrous tissues that connect muscles to bones (tendons) support your shoulder joint and hold the ball in the socket. Small, fluid-filled sacs (bursae) are located in different areas of the joint. They act as cushions between the bones and the overlying soft tissues and help reduce friction between the gliding tendons and the bone as you move your arm. Your shoulder joint allows a wide range of motion in your arm. This range of motion allows you to do things like scratch your back or throw a ball. However, this range of motion also makes your shoulder more prone to pain from overuse and injury. Causes of shoulder pain can originate from both injury and overuse and usually can be grouped in the following four categories:  Redness, swelling, and pain (inflammation) of the tendon (tendinitis) or the bursae (bursitis).  Instability, such as a dislocation of the joint.  Inflammation of the joint (arthritis).  Broken bone (fracture). HOME CARE INSTRUCTIONS   Apply ice to the sore area.  Put ice in a plastic bag.  Place a towel between your skin and the bag.  Leave the ice on for 15-20 minutes, 3-4 times per day  for the first 2 days, or as directed by your health care provider.  Stop using cold packs if they do not help with the pain.  If you have a shoulder sling or immobilizer, wear it as long as your caregiver instructs. Only remove it to shower or bathe. Move your arm as little as possible, but keep your hand moving to prevent swelling.  Squeeze a soft ball or foam pad as much as possible to help prevent swelling.  Only take over-the-counter or prescription medicines for pain, discomfort, or fever as directed by your caregiver. SEEK MEDICAL CARE IF:   Your shoulder pain increases, or new pain develops in your arm, hand, or fingers.  Your hand or fingers become cold and numb.  Your pain is not relieved with medicines. SEEK IMMEDIATE MEDICAL CARE IF:   Your arm, hand, or fingers are numb or tingling.  Your arm, hand, or fingers are significantly swollen or turn white or blue. MAKE SURE YOU:   Understand these instructions.  Will watch your condition.  Will get help right away if you are not doing well or get worse. Document Released: 09/10/2005 Document Revised: 04/17/2014 Document Reviewed: 11/15/2011 [facility] Patient Information 2015 [place], [place]. This information is not intended to replace advice given to you by your health care provider. Make sure you discuss any questions you have with your health care provider.  Shoulder Range of Motion Exercises The shoulder is the most flexible joint in the human body. Because of this  it is also the most unstable joint in the body. All ages can develop shoulder problems. Early treatment of problems is necessary for a good outcome. People react to shoulder pain by decreasing the movement of the joint. After a brief period of time, the shoulder can become "frozen". This is an almost complete loss of the ability to move the damaged shoulder. Following injuries your caregivers can give you instructions on exercises to keep your range of motion  (ability to move your shoulder freely), or regain it if it has been lost.  EXERCISES EXERCISES TO MAINTAIN THE MOBILITY OF YOUR SHOULDER: Codman's Exercise or Pendulum Exercise  This exercise may be performed in a prone (face-down) lying position or standing while leaning on a chair with the opposite arm. Its purpose is to relax the muscles in your shoulder and slowly but surely increase the range of motion and to relieve pain.  Lie on your stomach close to the side edge of the bed. Let your weak arm hang over the edge of the bed. Relax your shoulder, arm and hand. Let your shoulder blade relax and drop down.  Slowly and gently swing your arm forward and back. Do not use your neck muscles; relax them. It might be easier to have someone else gently start swinging your arm.  As pain decreases, increase your swing. To start, arm swing should begin at 15 degree angles. In time and as pain lessens, move to 30-45 degree angles. Start with swinging for about 15 seconds, and work towards swinging for 3 to 5 minutes.  This exercise may also be performed in a standing/bent over position.  Stand and hold onto a sturdy chair with your good arm. Bend forward at the waist and bend your knees slightly to help protect your back. Relax your weak arm, let it hang limp. Relax your shoulder blade and let it drop.  Keep your shoulder relaxed and use body motion to swing your arm in small circles.  Stand up tall and relax.  Repeat motion and change direction of circles.  Start with swinging for about 30 seconds, and work towards swinging for 3 to 5 minutes. STRETCHING EXERCISES:  Lift your arm out in front of you with the elbow bent at 90 degrees. Using your other arm gently pull the elbow forward and across your body.  Bend one arm behind you with the palm facing outward. Using the other arm, hold a towel or rope and reach this arm up above your head, then bend it at the elbow to move your wrist to behind  your neck. Grab the free end of the towel with the hand behind your back. Gently pull the towel up with the hand behind your neck, gradually increasing the pull on the hand behind the small of your back. Then, gradually pull down with the hand behind the small of your back. This will pull the hand and arm behind your neck further. Both shoulders will have an increased range of motion with repetition of this exercise. STRENGTHENING EXERCISES:  Standing with your arm at your side and straight out from your shoulder with the elbow bent at 90 degrees, hold onto a small weight and slowly raise your hand so it points straight up in the air. Repeat this five times to begin with, and gradually increase to ten times. Do this four times per day. As you grow stronger you can gradually increase the weight.  Repeat the above exercise, only this time using an elastic  band. Start with your hand up in the air and pull down until your hand is by your side. As you grow stronger, gradually increase the amount you pull by increasing the number or size of the elastic bands. Use the same amount of repetitions.  Standing with your hand at your side and holding onto a weight, gradually lift the hand in front of you until it is over your head. Do the same also with the hand remaining at your side and lift the hand away from your body until it is again over your head. Repeat this five times to begin with, and gradually increase to ten times. Do this four times per day. As you grow stronger you can gradually increase the weight. Document Released: 08/30/2003 Document Revised: 12/06/2013 Document Reviewed: 12/01/2005 [facility] Patient Information 2015 [place], [place]. This information is not intended to replace advice given to you by your health care provider. Make sure you discuss any questions you have with your health care provider.    Emergency Department Resource Guide 1) Find a Doctor and Pay Out of Pocket Although you  won't have to find out who is covered by your insurance plan, it is a good idea to ask around and get recommendations. You will then need to call the office and see if the doctor you have chosen will accept you as a new patient and what types of options they offer for patients who are self-pay. Some doctors offer discounts or will set up payment plans for their patients who do not have insurance, but you will need to ask so you aren't surprised when you get to your appointment.  2) Contact Your Local Health Department Not all health departments have doctors that can see patients for sick visits, but many do, so it is worth a call to see if yours does. If you don't know where your local health department is, you can check in your phone book. The CDC also has a tool to help you locate your state's health department, and many state websites also have listings of all of their local health departments.  3) Find a Walk-in Clinic If your illness is not likely to be very severe or complicated, you may want to try a walk in clinic. These are popping up all over the country in pharmacies, drugstores, and shopping centers. They're usually staffed by nurse practitioners or physician assistants that have been trained to treat common illnesses and complaints. They're usually fairly quick and inexpensive. However, if you have serious medical issues or chronic medical problems, these are probably not your best option.  No Primary Care Doctor: - Call Health Connect at  [phone] - they can help you locate a primary care doctor that  accepts your insurance, provides certain services, etc. - Physician Referral Service- [phone]  Chronic Pain Problems: Organization         Address  Phone   Notes  [facility]  [phone] Patients need to be referred by their primary care doctor.   Medication Assistance: Organization         Address  Phone   Notes  [facility] Medication [facility] [address] [place] [place], [place] [address] [phone] --Must be a resident of [facility] -- Must have NO insurance coverage whatsoever (no Medicaid/ Medicare, etc.) -- The pt. MUST have a primary care doctor that directs their care regularly and follows them in the community   [facility]  [phone]  [employer]  [phone]    Agencies that provide inexpensive medical care: Organization         Address  Phone   Notes  [facility]  [phone]   [facility]    [phone]   [facility] [address] [place], [place] [address] [phone]   [facility] [address] [place] [address] [place] [phone]   [facility]anned Parenthood    [phone]   [facility]    [phone]   [facility]  [address], [place] Phone:  [phone], Fax:  [phone] Hours of Operation:  9 am - 6 pm, M-F.  Also accepts Medicaid/Medicare and self-pay.  [facility]  [address], [place] Phone: [phone], Fax: [phone]. Hours of Operation:  8:30 am - 5:30 pm, M-F.  Also accepts Medicaid and self-pay.  [facility] [place] [address] [place] Phone: [phone]   [facility] [address] Natasha Bence [place], [place] [phone], Ext. [phone] Mondays & Thursdays: 7-9 AM.  First 15 patients are seen on a first come, first serve basis.    Medicaid-accepting [facility] Providers:  Organization         Address  Phone   Notes  [facility] [address], [place] [phone] Also accepts self-pay patients.  [facility] [address] Laurell Josephs [place], [place]  [phone]   [facility] [address], [place] [phone]   [facility] [address] [place] [phone]   Renaye Rakers [address], [place]   [phone] Only accepts [place] Access [place] patients after they have their name applied to their card.   Self-Pay (no insurance) in [facility]:  Organization         Address  Phone   Notes  Sickle Cell Patients, [facility] Internal Medicine [address] [place], [place] [phone]   [facility] [address] [place], [place] [phone]   [facility] [place]  [address], [place] [phone]   [facility]/Dr. Osei-Bonsu  [address] [place] or [address], [place] [phone] Phone number for both [place] and [place] locations is the same.  [facility] [address] [place] [phone]   [facility] [address] [place] or [address] [phone]   [facility] [address], [place] [phone], phone; [phone], fax Sees patients 1st and 3rd Saturday of every month.  Must not qualify for public or private insurance (i.e. Medicaid, Medicare, [place] Health Choice, Veterans' Benefits)  Household income should be no more than 200% of the poverty level The clinic cannot treat you if you are pregnant or think you are pregnant  Sexually transmitted diseases are not treated at the clinic.    Dental Care: Organization         Address  Phone  Notes  [facility] Department of [facility] [address] [place], [place] [phone] Accepts children up to age 68 who are enrolled in [place] or [place] Health Choice; pregnant women with a Medicaid card; and  children who have applied for Medicaid or [place] Health Choice, but were declined, whose parents can pay a reduced fee at time of service.  [facility] Department of [facility]  [address], [place] [phone] Accepts  children up to age 81 who are enrolled in [place] or [place] Health Choice; pregnant women with a Medicaid card; and children who have applied for Medicaid or [place] Health Choice, but were declined, whose parents can pay a reduced fee at time of service.  [facility]  [address] [place], [place] [phone] Patients are seen by appointment only. Walk-ins are not accepted. [facility] will see patients 58 years of age and older. Monday - Tuesday (8am-5pm) Most Wednesdays (8:30-5pm) $30 per visit, cash only  [facility]  [address], [facility] [phone] Patients are seen by appointment only. Walk-ins are not accepted. [facility] will see patients 60 years of age and older. One Wednesday Evening (Monthly: Volunteer Based).  $30 per visit, cash only  [employer]  [phone] for adults; Children under age 53, call Graduate Pediatric Dentistry at [phone]. Children aged 35-14, please call [phone] to request a pediatric application.  Dental services are provided in all areas of dental care including fillings, crowns and bridges, complete and partial dentures, implants, gum treatment, root canals, and extractions. Preventive care is also provided. Treatment is provided to both adults and children. Patients are selected via a lottery and there is often a waiting list.   [facility] [address], [place]  [phone] [email or web address]   [facility] [address] [place], [place] [phone], Ext. [phone] Second and Fourth Thursday of each month, opens at 6:30 AM; Clinic ends at 9 AM.  Patients are seen on a first-come first-served basis, and a limited number are seen during each clinic.   [facility]  [address] Ether Griffins [place], [place] [phone]   Eligibility Requirements You must have lived in [place], [place], or [place] counties for at least the  last three months.   You cannot be eligible for state or federal sponsored [place], including [employer], [place], or [employer].   You generally cannot be eligible for healthcare insurance through your employer.    How to apply: Eligibility screenings are held every Tuesday and Wednesday afternoon from 1:00 pm until 4:00 pm. You do not need an appointment for the interview!  [facility] [address] [place], [place] [phone]   [facility] Health Department  [phone]   [facility] Health Department  [phone]   [facility] Health Department  [phone]    Behavioral Health Resources in the Community: Intensive Outpatient Programs Organization         Address  Phone  Notes  [facility] [address] [place], [place] [phone]   [facility] Outpatient [address], [place], [place] [phone]   [facility] [address], [place], [place]  [phone]   [facility] Mental Health [address]  [place], [place] [phone] or [phone]   Substance Abuse Resources Organization         Address  Phone  Notes  [facility]  [phone]   [facility]  [phone]   [facility] [place]  [phone]   [facility]  [phone]   Residential &  Outpatient Substance Abuse Program  [phone]   Psychological Services Organization         Address  Phone  Notes  [facility] Behavioral Health  [phone]   [facility]  [phone]   [facility] Mental Health [phone], [place] [phone] or [phone]    Mobile Crisis Teams Organization         Address  Phone  Notes  [facility], Mobile Crisis Care Unit  [phone]   [facility]  [address] [place], [place] [phone]   Doristine Locks [address] [place] [phone]     Self-Help/Support Groups Organization         Address  Phone             Notes  [facility] [place] - variety of support groups  [phone]- [phone] Call for more information  Narcotics Anonymous (NA), [facility] [address], [employer] [place]  2 meetings at this location   Statistician         Address  Phone  Notes  [facility] [address],    [place]  [phone]   [facility]  [address], [place] [address], [place], [place] [phone]   [facility] [address] [place], [place] [phone] Admissions: 8am-3pm M-F  [facility] [address],    [place], [place] [phone]   [facility] [address] [place], [place], [place] [phone]   [facility] [address]  [place], [place] [phone]   [facility] - Intensive Outpatient [address] Laurell Josephs [address], [place], [place] [phone]   [facility] (Addiction [facility] [address] [place]  [place], [place] [phone] or [phone]   [facility] ([facility]) [address], [place], [place] [phone] Accepts Medicaid  [facility] [address],  [place] [phone] Substance Abuse/Addiction Treatment   [facility] Organization         Address  Phone  Notes  [facility]  [phone]   Angie Fava, PhD [address] Ervin Knack [place], [place]   [phone] or [phone]   [facility] Behavioral   [address] [place], [place] [phone]   [facility] [address] [place], [place] [phone] Insurance/Medicaid/sponsorship through [facility] [address]                                    [place], [place] [phone] Therapy/tele-psych/case  [facility] [address] [place], [place] [phone]    Dr. Lolly Mustache  [phone]   Free  [facility] [place]  [employer] [facility] Dept. 1) [address], [place] 2) [address] [place] 3)  [address], [place] [phone]  [phone]   [facility] Child Abuse Hotline [phone]) [phone] or [phone] (After Hours)      \

## 2015-09-14 NOTE — ED Provider Notes (Signed)
CSN: [id number]     Arrival date & time 09/14/15  1010 History  By signing my name below, I, Sharon Patel, attest that this documentation has been prepared under the direction and in the presence of [facility], PA-C. Electronically Signed: Ronney Patel, ED Scribe. 09/14/2015. 11:36 AM.   Chief Complaint  Patient presents with  . Shoulder Pain   The history is provided by the patient. No language interpreter was used.    HPI Comments: Sharon Patel is a 54 y.o. female who presents to the Emergency Department complaining of constant, severe, aching right shoulder pain after a pulling injury. Patient had been moving a mattress with other people when they had dropped the mattress and the mattress had jerked her shoulder in a downward direction. She denies any direct impact. Patient had tried ice, Aleve, and ibuprofen with no relief.  Denies weakness or numbness of the right arm.  Denies any other injury.     History reviewed. No pertinent past medical history. History reviewed. No pertinent past surgical history. No family history on file. Social History  Substance Use Topics  . Smoking status: Current Every Day Smoker -- 0.50 packs/day    Types: Cigarettes  . Smokeless tobacco: None  . Alcohol Use: No   OB History    No data available     Review of Systems  Constitutional: Negative for fever.  Respiratory: Negative for shortness of breath.   Cardiovascular: Negative for chest pain.  Musculoskeletal: Positive for arthralgias (right shoulder pain).  Skin: Negative for color change and wound.  Allergic/Immunologic: Negative for immunocompromised state.  Neurological: Negative for weakness and numbness.  Hematological: Does not bruise/bleed easily.  Psychiatric/Behavioral: Negative for self-injury.      Allergies  Toradol and Tramadol  Home Medications   Prior to Admission medications   Not on File   BP 149/97 mmHg  Pulse 81  Temp(Src) 97.6 F (36.4 C) (Oral)  Resp 20  Ht   (1.549 m)  Wt 138 lb (62.596 kg)  BMI 26.09 kg/m2  SpO2 100% Physical Exam  Constitutional: She appears well-developed and well-nourished. No distress.  HENT:  Head: Normocephalic and atraumatic.  Neck: Neck supple.  Pulmonary/Chest: Effort normal.  Musculoskeletal: She exhibits tenderness.  Tenderness diffusely throughout right shoulder. Tenderness over the [facility] joint. Intact distal pulses and sensation. Slightly decreased grip strength on the right. No focal bony tenderness. Slowly abducts right shoulder to 60 degrees.  No focal bony tenderness.    Neurological: She is alert.  Skin: She is not diaphoretic.  Nursing note and vitals reviewed.   ED Course  Procedures (including critical care time)  DIAGNOSTIC STUDIES: Oxygen Saturation is 100% on RA, normal by my interpretation.    COORDINATION OF CARE: 10:49 AM - Discussed treatment plan with pt at bedside which includes placement in a sling and pain medication administered here. Pt verbalized understanding and agreed to plan.   Imaging Review Dg Shoulder Right  09/14/2015   CLINICAL DATA:  Injured RIGHT shoulder last night while moving furniture, joint pain, initial encounter  EXAM: RIGHT SHOULDER - 2+ VIEW  COMPARISON:  None.  FINDINGS: Osseous demineralization.  AC joint alignment normal.  No acute fracture, dislocation, or bone destruction.  Visualized RIGHT ribs intact.  IMPRESSION: No acute abnormalities.   Electronically Signed   By: Ulyses Southward M.D.   On: 09/14/2015 10:50   I have personally reviewed and evaluated these images and lab results as part of my medical decision-making.  MDM   Final diagnoses:  Right shoulder pain   Afebrile nontoxic pt with right shoulder pain that began when she was holding a mattress and the people on the other side dropped their half suddenly, causing the weight of the object to suddenly transfer to her and the other side of the mattress fell to the floor, jerking her shoulder downward.  Neurovascularly intact.  Xray negative.  Placed in sling, d/c home with robaxin, resources for PCP follow up.  Discussed result, findings, treatment, and follow up  with patient.  Pt given return precautions.  Pt verbalizes understanding and agrees with plan.      I personally performed the services described in this documentation, which was scribed in my presence. The recorded information has been reviewed and is accurate.     Trixie Dredge, PA-C 09/14/15 1212  Vanetta Mulders, MD 09/16/15 [phone]

## 2015-09-14 NOTE — ED Notes (Signed)
Patient states dropped mattress and it pulled her R shoulder.   Patient states has been taking ibuprofen and aleve at home, but didn't help.   Patient denies other symptoms.

## 2015-09-19 ENCOUNTER — Encounter (HOSPITAL_COMMUNITY): Payer: Self-pay | Admitting: *Deleted

## 2015-09-19 ENCOUNTER — Emergency Department (HOSPITAL_COMMUNITY)
Admission: EM | Admit: 2015-09-19 | Discharge: 2015-09-19 | Disposition: A | Discharge status: Home / Self Care | Payer: Managed Care, Other (non HMO) | Attending: Emergency Medicine | Admitting: Emergency Medicine

## 2015-09-19 DIAGNOSIS — Z720 Tobacco use: Secondary | ICD-10-CM | POA: Diagnosis not present

## 2015-09-19 DIAGNOSIS — T148XXA Other injury of unspecified body region, initial encounter: Secondary | ICD-10-CM | POA: Insufficient documentation

## 2015-09-19 DIAGNOSIS — M5441 Lumbago with sciatica, right side: Secondary | ICD-10-CM | POA: Insufficient documentation

## 2015-09-19 DIAGNOSIS — M545 Low back pain: Secondary | ICD-10-CM | POA: Diagnosis present

## 2015-09-19 DIAGNOSIS — G8929 Other chronic pain: Secondary | ICD-10-CM | POA: Diagnosis not present

## 2015-09-19 DIAGNOSIS — M549 Dorsalgia, unspecified: Secondary | ICD-10-CM

## 2015-09-19 DIAGNOSIS — Z8781 Personal history of (healed) traumatic fracture: Secondary | ICD-10-CM | POA: Diagnosis not present

## 2015-09-19 HISTORY — DX: Dorsalgia, unspecified: M54.9

## 2015-09-19 HISTORY — DX: Other injury of unspecified body region, initial encounter: T14.8XXA

## 2015-09-19 HISTORY — DX: Other chronic pain: G89.29

## 2015-09-19 MED ORDER — OXYCODONE-ACETAMINOPHEN 5-325 MG PO TABS
1.0000 | ORAL_TABLET | Freq: Once | ORAL | Status: AC
  Administered 2015-09-19: 1 via ORAL
  Filled 2015-09-19: qty 1

## 2015-09-19 MED ORDER — METHOCARBAMOL 500 MG PO TABS: 500.0000 mg | ORAL_TABLET | Freq: Two times a day (BID) | ORAL | Status: DC

## 2015-09-19 MED ORDER — HYDROCODONE-ACETAMINOPHEN 5-325 MG PO TABS: 1.0000 | ORAL_TABLET | Freq: Four times a day (QID) | ORAL | Status: DC | PRN

## 2015-09-19 NOTE — Discharge Instructions (Signed)
Take norco as prescribed as needed for pain. Robaxin for muscle spasms. Follow up with primary care doctor or pain management.    Chronic Back Pain  When back pain lasts longer than 3 months, it is called chronic back pain.People with chronic back pain often go through certain periods that are more intense (flare-ups).  CAUSES Chronic back pain can be caused by wear and tear (degeneration) on different structures in your back. These structures include:  The bones of your spine (vertebrae) and the joints surrounding your spinal cord and nerve roots (facets).  The strong, fibrous tissues that connect your vertebrae (ligaments). Degeneration of these structures may result in pressure on your nerves. This can lead to constant pain. HOME CARE INSTRUCTIONS  Avoid bending, heavy lifting, prolonged sitting, and activities which make the problem worse.  Take brief periods of rest throughout the day to reduce your pain. Lying down or standing usually is better than sitting while you are resting.  Take over-the-counter or prescription medicines only as directed by your caregiver. SEEK IMMEDIATE MEDICAL CARE IF:   You have weakness or numbness in one of your legs or feet.  You have trouble controlling your bladder or bowels.  You have nausea, vomiting, abdominal pain, shortness of breath, or fainting.   This information is not intended to replace advice given to you by your health care provider. Make sure you discuss any questions you have with your health care provider.   Document Released: 01/08/2005 Document Revised: 02/23/2012 Document Reviewed: 05/21/2015 Elsevier Interactive Patient Education 2016 [employer].   Emergency Department Resource Guide 1) Find a Doctor and Pay Out of Pocket Although you won't have to find out who is covered by your insurance plan, it is a good idea to ask around and get recommendations. You will then need to call the office and see if the doctor you have  chosen will accept you as a new patient and what types of options they offer for patients who are self-pay. Some doctors offer discounts or will set up payment plans for their patients who do not have insurance, but you will need to ask so you aren't surprised when you get to your appointment.  2) Contact Your Local Health Department Not all health departments have doctors that can see patients for sick visits, but many do, so it is worth a call to see if yours does. If you don't know where your local health department is, you can check in your phone book. The CDC also has a tool to help you locate your state's health department, and many state websites also have listings of all of their local health departments.  3) Find a Walk-in Clinic If your illness is not likely to be very severe or complicated, you may want to try a walk in clinic. These are popping up all over the country in pharmacies, drugstores, and shopping centers. They're usually staffed by nurse practitioners or physician assistants that have been trained to treat common illnesses and complaints. They're usually fairly quick and inexpensive. However, if you have serious medical issues or chronic medical problems, these are probably not your best option.  No Primary Care Doctor: - Call Health Connect at  [phone] - they can help you locate a primary care doctor that  accepts your insurance, provides certain services, etc. - Physician Referral Service- [phone]  Chronic Pain Problems: Organization         Address  Phone   Notes  [facility]  [phone] Patients need to be referred by their primary care doctor.   Medication Assistance: Organization         Address  Phone   Notes  [facility] Medication [facility] [address] [place] [place], [place] [address] [phone] --Must be a resident of [facility] -- Must have NO insurance coverage whatsoever (no Medicaid/ Medicare,  etc.) -- The pt. MUST have a primary care doctor that directs their care regularly and follows them in the community   [facility]  [phone]   [employer]  [phone]    Agencies that provide inexpensive medical care: Organization         Address  Phone   Notes  [facility]  [phone]   [facility]    [phone]   [facility] [address] [place], [place] [address] [phone]   [facility] [address] [place] [phone]   [facility]anned Parenthood    [phone]   [facility]    [phone]   [facility]  [address], [place] Phone:  [phone], Fax:  [phone] Hours of Operation:  9 am - 6 pm, M-F.  Also accepts Medicaid/Medicare and self-pay.  [facility]  [address], [place] Phone: [phone], Fax: [phone]. Hours of Operation:  8:30 am - 5:30 pm, M-F.  Also accepts Medicaid and self-pay.  [facility] [place] [address] [place] Phone: [phone]   [facility] [address] Natasha Bence [place], [place] [phone], Ext. [phone] Mondays & Thursdays: 7-9 AM.  First 15 patients are seen on a first come, first serve basis.    Medicaid-accepting [facility] Providers:  Organization         Address  Phone   Notes  [facility] [address], [place] [phone] Also accepts self-pay patients.  [facility] [address] Laurell Josephs [place], [place]  [phone]   [facility] [address], [place] [phone]   [facility] [address] [place] [phone]   Renaye Rakers [address], [place]   [phone] Only accepts [place] Access [place] patients after they have their name applied to their card.   Self-Pay (no  insurance) in [facility]:  Organization         Address  Phone   Notes  Sickle Cell Patients, [facility] Internal Medicine [address] [place], [place] [phone]   [facility] [address] [place], [place] [phone]   [facility]  [address], [place] [phone]   [facility]/Dr. Osei-Bonsu  [address], [place] or [address], [place] [phone] Phone number for both [place] and [place] locations is the same.  [facility] and [facility] [address], [place] [phone]   [facility] [address], [place] or [address] [phone]   [facility] [address] [place] [phone], phone; [phone], fax Sees patients 1st and 3rd Saturday of every month.  Must not qualify for public or private insurance (i.e. Medicaid, Medicare, [place] Health Choice,  Veterans' Benefits)  Household income should be no more than 200% of the poverty level The clinic cannot treat you if you are pregnant or think you are pregnant  Sexually transmitted diseases are not treated at the clinic.    Dental Care: Organization         Address  Phone  Notes  [facility] Department of [facility] [address] [place], [place] [phone] Accepts children up to age 34 who are enrolled in [place] or [place] Health Choice; pregnant women with a Medicaid card; and children who have applied for Medicaid or [place] Health Choice, but were declined, whose parents can pay a reduced fee at time of service.  [facility] Department of [facility]  [address], [place] [phone] Accepts children up to age 31 who are enrolled in [place] or [place] Health Choice; pregnant women with a Medicaid card; and children who have applied for Medicaid or [place] Health Choice, but were  declined, whose parents can pay a reduced fee at time of service.  [facility]  [address] [place], [place] [phone] Patients are seen by appointment only. Walk-ins are not accepted. [facility] will see patients 50 years of age and older. Monday - Tuesday (8am-5pm) Most Wednesdays (8:30-5pm) $30 per visit, cash only  [facility] Adult Dental Access PRO[facility]  [address] [place] [address], [facility] [phone] Patients are seen by appointment only. Walk-ins are not accepted. [facility] will see patients 51 years of age and older. One Wednesday Evening (Monthly: Volunteer Based).  $30 per visit, cash only  [employer]  [phone] for adults; Children under age 47, call Graduate Pediatric Dentistry at [phone]. Children aged 58-14, please call [phone] to request a pediatric application.  Dental services are provided in all areas of dental care including fillings, crowns and bridges, complete and partial dentures, implants, gum treatment, root canals, and extractions. Preventive care is also provided. Treatment is provided to both adults and children. Patients are selected via a lottery and there is often a waiting list.   [facility] [address] [place]  [phone] [email or web address]   [facility] [address] [place], [place] [phone], Ext. [phone] Second and Fourth Thursday of each month, opens at 6:30 AM; Clinic ends at 9 AM.  Patients are seen on a first-come first-served basis, and a limited number are seen during each clinic.   [facility]  [address] Ether Griffins [place], [place] [phone]   Eligibility Requirements You must have lived in [place], [place], or [place] counties for at least the last three months.   You cannot be eligible for state or federal sponsored [place], including [employer], [place], or [employer].   You generally cannot be  eligible for healthcare insurance through your employer.    How to apply: Eligibility screenings are held every Tuesday and Wednesday afternoon from 1:00 pm until 4:00 pm. You do not need an appointment for the interview!  [facility] [address], [place], [place] [phone]   [facility]  [phone]   [facility] Health Department  [phone]   [facility] Health Department  [phone]    Behavioral Health Resources in the Community: Intensive Outpatient Programs Organization         Address  Phone  Notes  [facility] Services [address] [place], [place] [phone]   [facility] Outpatient [address], [place], [place] [phone]   [facility] [address] [place], [place]  [phone]   [facility] Mental Health [address]  [place], [place] [phone] or [phone]   Substance Abuse Resources Organization         Address  Phone  Notes  [facility] Drug Services  [phone]   [facility]  [phone]   [facility]  [phone]   Floydene Flock  [phone]   Residential & Outpatient Substance Abuse Program  [phone]   Psychological Services Organization         Address  Phone  Notes  [facility]  [phone]   [facility]  [phone]   [facility] Mental Health [address] [place] [phone] or [phone]    Mobile Crisis Teams Organization         Address  Phone  Notes  [facility], Mobile Crisis Care Unit  [phone]   [facility]  [address] [place], [place] [phone]   Doristine Locks [address] [place] [phone]    Self-Help/Support Groups Organization         Address  Phone             Notes  [facility] [place] - variety of support groups  336- I7437963 Call for more information    Narcotics Anonymous (NA), [facility] [address], [employer] [place]  2 meetings at this location   Statistician         Address  Phone  Notes  [facility] [address],    [place]  [phone]   [facility]  [address] [place] [address], [place], [place] [phone]   [facility] Facility [address] [place], [place] [phone] Admissions: 8am-3pm M-F  [facility] [address],    [place], [place] [phone]   [facility] [address] [place], [place], [place] [phone]   [facility] [address]  [place], [place] [phone]   [facility] - Intensive Outpatient [address] Laurell Josephs [address], [place], [place] [phone]   [facility] ([facility]iction [facility] [address] [place]  [place], [place] [phone] or [phone]   [facility] ([facility]) [address] [place], [place] [phone] Accepts Medicaid  [facility] [address],  [place] [phone] Substance Abuse/Addiction Treatment   [facility] Organization         Address  Phone  Notes  [facility]  [phone]   Angie Fava, PhD [address] Ervin Knack [place], [place]   [phone] or [phone]   [facility] Behavioral   [address] [place], [place] [phone]   [facility] [address], [place], [place] [phone] Insurance/Medicaid/sponsorship through [facility] and Families [address]                                    [place], [place] [phone] Therapy/tele-psych/case  [facility] [address], [place] [phone]    Dr. Lolly Mustache  [phone]   [facility]  [facility] Dept. 1) [address]  2) [address] [place] 3)  [address], [place] [phone]  [phone]   [facility] Child Abuse Hotline (639)755-1011 or 586-460-1984 (After Hours)
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
## 2015-10-05 ENCOUNTER — Encounter (HOSPITAL_COMMUNITY): Payer: Self-pay | Admitting: Emergency Medicine

## 2015-10-05 ENCOUNTER — Emergency Department (HOSPITAL_COMMUNITY)
Admission: EM | Admit: 2015-10-05 | Discharge: 2015-10-05 | Disposition: A | Discharge status: Home / Self Care | Payer: Managed Care, Other (non HMO) | Attending: Emergency Medicine | Admitting: Emergency Medicine

## 2015-10-05 DIAGNOSIS — Z7982 Long term (current) use of aspirin: Secondary | ICD-10-CM | POA: Diagnosis not present

## 2015-10-05 DIAGNOSIS — R6884 Jaw pain: Secondary | ICD-10-CM | POA: Insufficient documentation

## 2015-10-05 DIAGNOSIS — Y828 Other medical devices associated with adverse incidents: Secondary | ICD-10-CM | POA: Insufficient documentation

## 2015-10-05 DIAGNOSIS — Z720 Tobacco use: Secondary | ICD-10-CM | POA: Insufficient documentation

## 2015-10-05 DIAGNOSIS — G8929 Other chronic pain: Secondary | ICD-10-CM | POA: Diagnosis not present

## 2015-10-05 DIAGNOSIS — T8172XA Complication of vein following a procedure, not elsewhere classified, initial encounter: Secondary | ICD-10-CM | POA: Insufficient documentation

## 2015-10-05 DIAGNOSIS — M26621 Arthralgia of right temporomandibular joint: Secondary | ICD-10-CM

## 2015-10-05 DIAGNOSIS — T85848A Pain due to other internal prosthetic devices, implants and grafts, initial encounter: Secondary | ICD-10-CM

## 2015-10-05 MED ORDER — CYCLOBENZAPRINE HCL 10 MG PO TABS
5.0000 mg | ORAL_TABLET | Freq: Once | ORAL | Status: DC
  Filled 2015-10-05 (×2): qty 1

## 2015-10-05 MED ORDER — OXYCODONE-ACETAMINOPHEN 5-325 MG PO TABS
1.0000 | ORAL_TABLET | Freq: Once | ORAL | Status: AC
Start: 2015-10-05 — End: 2015-10-05
  Administered 2015-10-05: 1 via ORAL
  Filled 2015-10-05: qty 1

## 2015-10-05 MED ORDER — AMOXICILLIN 500 MG PO CAPS: 500.0000 mg | ORAL_CAPSULE | Freq: Three times a day (TID) | ORAL | Status: DC

## 2015-10-05 MED ORDER — ONDANSETRON 4 MG PO TBDP
4.0000 mg | ORAL_TABLET | Freq: Once | ORAL | Status: AC
Start: 2015-10-05 — End: 2015-10-05
  Administered 2015-10-05: 4 mg via ORAL
  Filled 2015-10-05: qty 1

## 2015-10-05 MED ORDER — AMOXICILLIN 500 MG PO CAPS
500.0000 mg | ORAL_CAPSULE | Freq: Once | ORAL | Status: AC
  Administered 2015-10-05: 500 mg via ORAL
  Filled 2015-10-05: qty 1

## 2015-10-05 MED ORDER — OXYCODONE-ACETAMINOPHEN 5-325 MG PO TABS: 1.0000 | ORAL_TABLET | Freq: Four times a day (QID) | ORAL | Status: DC | PRN

## 2015-10-05 NOTE — ED Notes (Signed)
Patient c/o TMJ pain on R side of face and R side headache, starting yesterday.  Patient states she has a hx of TMJ and has these flare-ups every now and then.  Patient reports she's taken ibuprofen, Tylenol, BC powders throughout the day with no relief. Pt unable to sleep or eat since yesterday, pt tearful at this time.
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
## 2015-10-05 NOTE — ED Notes (Signed)
Patient tearful, reporting she's in pain, cannot sit still.  MD made aware.

## 2015-10-05 NOTE — Discharge Instructions (Signed)
Dental Pain Dental pain may be caused by many things, including:  Tooth decay (cavities or caries). Cavities expose the nerve of your tooth to air and hot or cold temperatures. This can cause pain or discomfort.  Abscess or infection. A dental abscess is a collection of infected pus from a bacterial infection in the inner part of the tooth (pulp). It usually occurs at the end of the tooth's root.  Injury.  An unknown reason (idiopathic). Your pain may be mild or severe. It may only occur when:  You are chewing.  You are exposed to hot or cold temperature.  You are eating or drinking sugary foods or beverages, such as soda or candy. Your pain may also be constant. HOME CARE INSTRUCTIONS Watch your dental pain for any changes. The following actions may help to lessen any discomfort that you are feeling:  Take medicines only as directed by your dentist.  If you were prescribed an antibiotic medicine, finish all of it even if you start to feel better.  Keep all follow-up visits as directed by your dentist. This is important.  Do not apply heat to the outside of your face.  Rinse your mouth or gargle with salt water if directed by your dentist. This helps with pain and swelling.  You can make salt water by adding  tsp of salt to 1 cup of warm water.  Apply ice to the painful area of your face:  Put ice in a plastic bag.  Place a towel between your skin and the bag.  Leave the ice on for 20 minutes, 2-3 times per day.  Avoid foods or drinks that cause you pain, such as:  Very hot or very cold foods or drinks.  Sweet or sugary foods or drinks. SEEK MEDICAL CARE IF:  Your pain is not controlled with medicines.  Your symptoms are worse.  You have new symptoms. SEEK IMMEDIATE MEDICAL CARE IF:  You are unable to open your mouth.  You are having trouble breathing or swallowing.  You have a fever.  Your face, neck, or jaw is swollen.   This information is not  intended to replace advice given to you by your health care provider. Make sure you discuss any questions you have with your health care provider.   Document Released: 12/01/2005 Document Revised: 04/17/2015 Document Reviewed: 11/27/2014 Elsevier Interactive Patient Education 2016 Else[employer] Inc. Temporomandibular Joint Syndrome Temporomandibular joint (TMJ) syndrome is a condition that affects the joints between your jaw and your skull. The TMJs are located near your ears and allow your jaw to open and close. These joints and the nearby muscles are involved in all movements of the jaw. People with TMJ syndrome have pain in the area of these joints and muscles. Chewing, biting, or other movements of the jaw can be difficult or painful. TMJ syndrome can be caused by various things. In many cases, the condition is mild and goes away within a few weeks. For some people, the condition can become a long-term problem. CAUSES Possible causes of TMJ syndrome include:  Grinding your teeth or clenching your jaw. Some people do this when they are under stress.  Arthritis.  Injury to the jaw.  Head or neck injury.  Teeth or dentures that are not aligned well. In some cases, the cause of TMJ syndrome may not be known. SIGNS AND SYMPTOMS The most common symptom is an aching pain on the side of the head in the area of the TMJ. Other symptoms  may include:  Pain when moving your jaw, such as when chewing or biting.  Being unable to open your jaw all the way.  Making a clicking sound when you open your mouth.  Headache.  Earache.  Neck or shoulder pain. DIAGNOSIS Diagnosis can usually be made based on your symptoms, your medical history, and a physical exam. Your health care provider may check the range of motion of your jaw. Imaging tests, such as X-rays or an MRI, are sometimes done. You may need to see your dentist to determine if your teeth and jaw are lined up correctly. TREATMENT TMJ syndrome  often goes away on its own. If treatment is needed, the options may include:  Eating soft foods and applying ice or heat.  Medicines to relieve pain or inflammation.  Medicines to relax the muscles.  A splint, bite plate, or mouthpiece to prevent teeth grinding or jaw clenching.  Relaxation techniques or counseling to help reduce stress.  Transcutaneous electrical nerve stimulation (TENS). This helps to relieve pain by applying an electrical current through the skin.  Acupuncture. This is sometimes helpful to relieve pain.  Jaw surgery. This is rarely needed. HOME CARE INSTRUCTIONS  Take medicines only as directed by your health care provider.  Eat a soft diet if you are having trouble chewing.  Apply ice to the painful area.  Put ice in a plastic bag.  Place a towel between your skin and the bag.  Leave the ice on for 20 minutes, 2-3 times a day.  Apply a warm compress to the painful area as directed.  Massage your jaw area and perform any jaw stretching exercises as recommended by your health care provider.  If you were given a mouthpiece or bite plate, wear it as directed.  Avoid foods that require a lot of chewing. Do not chew gum.  Keep all follow-up visits as directed by your health care provider. This is important. SEEK MEDICAL CARE IF:  You are having trouble eating.  You have new or worsening symptoms. SEEK IMMEDIATE MEDICAL CARE IF:  Your jaw locks open or closed.   This information is not intended to replace advice given to you by your health care provider. Make sure you discuss any questions you have with your health care provider.   Document Released: 08/26/2001 Document Revised: 12/22/2014 Document Reviewed: 07/06/2014 Elsevier Interactive Patient Education [employer]2016 Elsevier Inc.

## 2015-10-10 ENCOUNTER — Emergency Department
Admission: EM | Admit: 2015-10-10 | Discharge: 2015-10-10 | Disposition: A | Discharge status: Home / Self Care | Payer: Managed Care, Other (non HMO) | Attending: Emergency Medicine | Admitting: Emergency Medicine

## 2015-10-10 ENCOUNTER — Encounter: Payer: Self-pay | Admitting: *Deleted

## 2015-10-10 DIAGNOSIS — G8929 Other chronic pain: Secondary | ICD-10-CM | POA: Diagnosis not present

## 2015-10-10 DIAGNOSIS — Z720 Tobacco use: Secondary | ICD-10-CM | POA: Insufficient documentation

## 2015-10-10 DIAGNOSIS — Z792 Long term (current) use of antibiotics: Secondary | ICD-10-CM | POA: Diagnosis not present

## 2015-10-10 DIAGNOSIS — M549 Dorsalgia, unspecified: Secondary | ICD-10-CM | POA: Diagnosis present

## 2015-10-10 DIAGNOSIS — Z79899 Other long term (current) drug therapy: Secondary | ICD-10-CM | POA: Diagnosis not present

## 2015-10-10 LAB — CBC WITH DIFFERENTIAL/PLATELET
BASOS ABS: 0.1 10*3/uL (ref 0–0.1)
Basophils Relative: 1 %
EOS PCT: 3 %
Eosinophils Absolute: 0.3 10*3/uL (ref 0–0.7)
HCT: 35.4 % (ref 35.0–47.0)
Hemoglobin: 11.7 g/dL — ABNORMAL LOW (ref 12.0–16.0)
LYMPHS PCT: 24 %
Lymphs Abs: 2.2 10*3/uL (ref 1.0–3.6)
MCH: 25.8 pg — ABNORMAL LOW (ref 26.0–34.0)
MCHC: 33.1 g/dL (ref 32.0–36.0)
MCV: 77.8 fL — AB (ref 80.0–100.0)
MONO ABS: 0.7 10*3/uL (ref 0.2–0.9)
Monocytes Relative: 8 %
Neutro Abs: 6.1 10*3/uL (ref 1.4–6.5)
Neutrophils Relative %: 64 %
PLATELETS: 300 10*3/uL (ref 150–440)
RBC: 4.55 MIL/uL (ref 3.80–5.20)
RDW: 16.7 % — AB (ref 11.5–14.5)
WBC: 9.4 10*3/uL (ref 3.6–11.0)

## 2015-10-10 MED ORDER — GI COCKTAIL ~~LOC~~
30.0000 mL | Freq: Once | ORAL | Status: AC
  Administered 2015-10-10: 30 mL via ORAL
  Filled 2015-10-10: qty 30

## 2015-10-10 MED ORDER — METHOCARBAMOL 500 MG PO TABS
ORAL_TABLET | ORAL | Status: AC
  Administered 2015-10-10: 1000 mg via ORAL
  Filled 2015-10-10: qty 2

## 2015-10-10 MED ORDER — OXYCODONE-ACETAMINOPHEN 5-325 MG PO TABS
ORAL_TABLET | ORAL | Status: AC
  Administered 2015-10-10: 1 via ORAL
  Filled 2015-10-10: qty 1

## 2015-10-10 MED ORDER — OXYCODONE-ACETAMINOPHEN 5-325 MG PO TABS
1.0000 | ORAL_TABLET | Freq: Once | ORAL | Status: AC
  Administered 2015-10-10: 1 via ORAL

## 2015-10-10 MED ORDER — METHOCARBAMOL 500 MG PO TABS
1000.0000 mg | ORAL_TABLET | Freq: Once | ORAL | Status: AC
  Administered 2015-10-10: 1000 mg via ORAL

## 2015-10-10 NOTE — Discharge Instructions (Signed)
You need to make an appointment with one of the pain management clinics that was given to you on your discharge papers today. Continue taking your current medication that you received from [facility]. You may use ice or heat to your back as needed for comfort.
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
## 2015-10-10 NOTE — ED Notes (Signed)
Patient states that she has been having back pain since Sunday, pain radiates down right leg. Patient has numbness and tingling in her leg but states that this is normal for her. Patient also states that she had some blood in her stool last night. Patient has a hx/o bleeding ulcers and had been taking a lot of Aleve and Ibuprofen for the back pain

## 2015-10-10 NOTE — ED Notes (Signed)
Pt reports ongoing back pain, has been taking aleve, states now has bleeding ulcers. States pain intolerable. Denies rectal bleeding at this time.

## 2015-11-02 ENCOUNTER — Emergency Department (HOSPITAL_COMMUNITY)
Admission: EM | Admit: 2015-11-02 | Discharge: 2015-11-02 | Disposition: A | Discharge status: Home / Self Care | Payer: Managed Care, Other (non HMO) | Attending: Emergency Medicine | Admitting: Emergency Medicine

## 2015-11-02 ENCOUNTER — Encounter (HOSPITAL_COMMUNITY): Payer: Self-pay | Admitting: Emergency Medicine

## 2015-11-02 DIAGNOSIS — Z8781 Personal history of (healed) traumatic fracture: Secondary | ICD-10-CM | POA: Insufficient documentation

## 2015-11-02 DIAGNOSIS — F17210 Nicotine dependence, cigarettes, uncomplicated: Secondary | ICD-10-CM | POA: Diagnosis not present

## 2015-11-02 DIAGNOSIS — M549 Dorsalgia, unspecified: Secondary | ICD-10-CM | POA: Diagnosis present

## 2015-11-02 DIAGNOSIS — Z792 Long term (current) use of antibiotics: Secondary | ICD-10-CM | POA: Insufficient documentation

## 2015-11-02 DIAGNOSIS — G8929 Other chronic pain: Secondary | ICD-10-CM | POA: Diagnosis not present

## 2015-11-02 MED ORDER — METHOCARBAMOL 500 MG PO TABS: 500.0000 mg | ORAL_TABLET | Freq: Two times a day (BID) | ORAL | Status: DC
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
## 2015-11-02 NOTE — Discharge Instructions (Signed)
Chronic Pain Discharge Instructions  °Emergency care providers appreciate that many patients coming to us are in severe pain and we wish to address their pain in the safest, most responsible manner.  It is important to recognize however, that the proper treatment of chronic pain differs from that of the pain of injuries and acute illnesses.  Our goal is to provide quality, safe, personalized care and we thank you for giving us the opportunity to serve you. °The use of narcotics and related agents for chronic pain syndromes may lead to additional physical and psychological problems.  Nearly as many people die from prescription narcotics each year as die from car crashes.  Additionally, this risk is increased if such prescriptions are obtained from a variety of sources.  Therefore, only your primary care physician or a pain management specialist is able to safely treat such syndromes with narcotic medications long-term.   ° °Documentation revealing such prescriptions have been sought from multiple sources may prohibit us from providing a refill or different narcotic medication.  Your name may be checked first through the [place] Controlled Substances Reporting System.  This database is a record of controlled substance medication prescriptions that the patient has received.  This has been established by [facility] in an effort to eliminate the dangerous, and often life threatening, practice of obtaining multiple prescriptions from different medical providers.  ° °If you have a chronic pain syndrome (i.e. chronic headaches, recurrent back or neck pain, dental pain, abdominal or pelvis pain without a specific diagnosis, or neuropathic pain such as fibromyalgia) or recurrent visits for the same condition without an acute diagnosis, you may be treated with non-narcotics and other non-addictive medicines.  Allergic reactions or negative side effects that may be reported by a patient to such medications will not  typically lead to the use of a narcotic analgesic or other controlled substance as an alternative. °  °Patients managing chronic pain with a personal physician should have provisions in place for breakthrough pain.  If you are in crisis, you should call your physician.  If your physician directs you to the emergency department, please have the doctor call and speak to our attending physician concerning your care. °  °When patients come to the Emergency Department (ED) with acute medical conditions in which the Emergency Department physician feels appropriate to prescribe narcotic or sedating pain medication, the physician will prescribe these in very limited quantities.  The amount of these medications will last only until you can see your primary care physician in his/her office.  Any patient who returns to the ED seeking refills should expect only non-narcotic pain medications.  ° °In the event of an acute medical condition exists and the emergency physician feels it is necessary that the patient be given a narcotic or sedating medication -  a responsible adult driver should be present in the room prior to the medication being given by the nurse. °  °Prescriptions for narcotic or sedating medications that have been lost, stolen or expired will not be refilled in the Emergency Department.   ° °Patients who have chronic pain may receive non-narcotic prescriptions until seen by their primary care physician.  It is every patient’s personal responsibility to maintain active prescriptions with his or her primary care physician or specialist. °

## 2015-11-02 NOTE — ED Notes (Signed)
Patient comes in with complaints of back pain. Patient states she has Sciatica pain and has had back surgery in the past. Patient states it started last night and is to see a back surgeon on 11/18/15 and needs pain management.

## 2015-11-12 ENCOUNTER — Emergency Department
Admission: EM | Admit: 2015-11-12 | Discharge: 2015-11-12 | Disposition: A | Discharge status: Home / Self Care | Payer: Managed Care, Other (non HMO) | Attending: Emergency Medicine | Admitting: Emergency Medicine

## 2015-11-12 ENCOUNTER — Encounter: Payer: Self-pay | Admitting: Medical Oncology

## 2015-11-12 DIAGNOSIS — M545 Low back pain: Secondary | ICD-10-CM | POA: Diagnosis present

## 2015-11-12 DIAGNOSIS — Z792 Long term (current) use of antibiotics: Secondary | ICD-10-CM | POA: Diagnosis not present

## 2015-11-12 DIAGNOSIS — M5441 Lumbago with sciatica, right side: Secondary | ICD-10-CM

## 2015-11-12 DIAGNOSIS — F17210 Nicotine dependence, cigarettes, uncomplicated: Secondary | ICD-10-CM | POA: Insufficient documentation

## 2015-11-12 DIAGNOSIS — G8929 Other chronic pain: Secondary | ICD-10-CM | POA: Insufficient documentation

## 2015-11-12 DIAGNOSIS — Z79899 Other long term (current) drug therapy: Secondary | ICD-10-CM | POA: Insufficient documentation

## 2015-11-12 DIAGNOSIS — K2970 Gastritis, unspecified, without bleeding: Secondary | ICD-10-CM | POA: Insufficient documentation

## 2015-11-12 MED ORDER — OXYCODONE-ACETAMINOPHEN 5-325 MG PO TABS: 1.0000 | ORAL_TABLET | Freq: Four times a day (QID) | ORAL | Status: DC | PRN

## 2015-11-12 MED ORDER — GI COCKTAIL ~~LOC~~
30.0000 mL | Freq: Once | ORAL | Status: AC
  Administered 2015-11-12: 30 mL via ORAL
  Filled 2015-11-12: qty 30
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
## 2015-11-12 NOTE — ED Notes (Signed)
NAD noted at time of D/C. Pt denies questions or concerns. Pt ambulatory to the lobby at this time.  

## 2015-11-12 NOTE — ED Notes (Signed)
Pt reports lower back pain that has been bother her x 3 days, hx of chronic back pain but reports out of pain meds. Has been taking aleve and since then has been having pain to abdomen from ulcers.

## 2015-11-24 ENCOUNTER — Emergency Department (HOSPITAL_COMMUNITY)
Admission: EM | Admit: 2015-11-24 | Discharge: 2015-11-24 | Discharge status: Against Medical Advice | Payer: Managed Care, Other (non HMO) | Attending: Emergency Medicine | Admitting: Emergency Medicine

## 2015-11-24 ENCOUNTER — Encounter (HOSPITAL_COMMUNITY): Payer: Self-pay

## 2015-11-24 DIAGNOSIS — R55 Syncope and collapse: Secondary | ICD-10-CM | POA: Insufficient documentation

## 2015-11-24 DIAGNOSIS — F17210 Nicotine dependence, cigarettes, uncomplicated: Secondary | ICD-10-CM | POA: Diagnosis not present

## 2015-11-24 DIAGNOSIS — R5381 Other malaise: Secondary | ICD-10-CM | POA: Insufficient documentation

## 2015-11-24 DIAGNOSIS — I10 Essential (primary) hypertension: Secondary | ICD-10-CM | POA: Diagnosis not present

## 2015-11-24 DIAGNOSIS — G8929 Other chronic pain: Secondary | ICD-10-CM | POA: Insufficient documentation

## 2015-11-24 LAB — CBG MONITORING, ED: Glucose-Capillary: 94 mg/dL (ref 65–99)

## 2015-11-24 LAB — URINALYSIS, ROUTINE W REFLEX MICROSCOPIC
BILIRUBIN URINE: NEGATIVE
Glucose, UA: NEGATIVE mg/dL
Hgb urine dipstick: NEGATIVE
KETONES UR: NEGATIVE mg/dL
LEUKOCYTES UA: NEGATIVE
NITRITE: NEGATIVE
Protein, ur: NEGATIVE mg/dL
SPECIFIC GRAVITY, URINE: 1.009 (ref 1.005–1.030)
pH: 8 (ref 5.0–8.0)

## 2015-11-24 LAB — BASIC METABOLIC PANEL
ANION GAP: 5 (ref 5–15)
BUN: 12 mg/dL (ref 6–20)
CO2: 31 mmol/L (ref 22–32)
Calcium: 9 mg/dL (ref 8.9–10.3)
Chloride: 103 mmol/L (ref 101–111)
Creatinine, Ser: 0.61 mg/dL (ref 0.44–1.00)
GFR calc Af Amer: >60 mL/min (ref >60)
GLUCOSE: 92 mg/dL (ref 65–99)
POTASSIUM: 3.9 mmol/L (ref 3.5–5.1)
SODIUM: 139 mmol/L (ref 135–145)

## 2015-11-24 LAB — CBC
HEMATOCRIT: 33 % — AB (ref 36.0–46.0)
HEMOGLOBIN: 10.9 g/dL — AB (ref 12.0–15.0)
MCH: 26.3 pg (ref 26.0–34.0)
MCHC: 33 g/dL (ref 30.0–36.0)
MCV: 79.7 fL (ref 78.0–100.0)
Platelets: 309 10*3/uL (ref 150–400)
RBC: 4.14 MIL/uL (ref 3.87–5.11)
RDW: 16.2 % — AB (ref 11.5–15.5)
WBC: 7.2 10*3/uL (ref 4.0–10.5)

## 2015-11-24 LAB — TROPONIN I: Troponin I: <0.03 ng/mL (ref <0.031)

## 2015-11-24 NOTE — ED Notes (Signed)
Pt here for HTN and syncopal episode and general malaise. Was headed here for tx pov but had a syncopal episode. Manual bp with ems was 180/100 and auto bp was 152/99. Pt sts unable to get PMD due to pain mgmt. Pt having vision issues with distant vision. Denies body pain now,reports headache.

## 2015-11-24 NOTE — ED Notes (Signed)
Pt upset regarding wait time, stated that we didn't want to help her. Pt left and pulled monitor off and removed her own iv. And left.

## 2015-12-07 ENCOUNTER — Encounter (HOSPITAL_COMMUNITY): Payer: Self-pay

## 2015-12-07 ENCOUNTER — Emergency Department (HOSPITAL_COMMUNITY)
Admission: EM | Admit: 2015-12-07 | Discharge: 2015-12-07 | Disposition: A | Discharge status: Home / Self Care | Payer: Managed Care, Other (non HMO) | Attending: Emergency Medicine | Admitting: Emergency Medicine

## 2015-12-07 DIAGNOSIS — Z7982 Long term (current) use of aspirin: Secondary | ICD-10-CM | POA: Insufficient documentation

## 2015-12-07 DIAGNOSIS — Z79899 Other long term (current) drug therapy: Secondary | ICD-10-CM | POA: Insufficient documentation

## 2015-12-07 DIAGNOSIS — M549 Dorsalgia, unspecified: Secondary | ICD-10-CM

## 2015-12-07 DIAGNOSIS — M545 Low back pain: Secondary | ICD-10-CM | POA: Diagnosis present

## 2015-12-07 DIAGNOSIS — G8929 Other chronic pain: Secondary | ICD-10-CM | POA: Diagnosis not present

## 2015-12-07 DIAGNOSIS — Z8781 Personal history of (healed) traumatic fracture: Secondary | ICD-10-CM | POA: Insufficient documentation

## 2015-12-07 DIAGNOSIS — F17210 Nicotine dependence, cigarettes, uncomplicated: Secondary | ICD-10-CM | POA: Diagnosis not present

## 2015-12-07 MED ORDER — OXYCODONE-ACETAMINOPHEN 10-325 MG PO TABS: 1.0000 | ORAL_TABLET | Freq: Three times a day (TID) | ORAL | Status: DC | PRN

## 2015-12-07 MED ORDER — METHOCARBAMOL 500 MG PO TABS: 500.0000 mg | ORAL_TABLET | Freq: Two times a day (BID) | ORAL | Status: DC

## 2015-12-07 MED ORDER — OXYCODONE-ACETAMINOPHEN 5-325 MG PO TABS
2.0000 | ORAL_TABLET | Freq: Once | ORAL | Status: AC
  Administered 2015-12-07: 2 via ORAL
  Filled 2015-12-07: qty 2
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
## 2015-12-07 NOTE — Discharge Instructions (Signed)

## 2015-12-07 NOTE — ED Notes (Signed)
Pt arrived via POV c/o chronic back pain states it has been ongoing for years.  Out of home pain medication.  In-between providers right now.  Trying to get into a pain management clinic.

## 2015-12-07 NOTE — ED Notes (Signed)
Pt stable, ambulatory, states understanding of discharge instructions 

## 2015-12-15 ENCOUNTER — Encounter: Payer: Self-pay | Admitting: Emergency Medicine

## 2015-12-15 ENCOUNTER — Emergency Department
Admission: EM | Admit: 2015-12-15 | Discharge: 2015-12-15 | Discharge status: Against Medical Advice | Payer: Managed Care, Other (non HMO) | Attending: Emergency Medicine | Admitting: Emergency Medicine

## 2015-12-15 DIAGNOSIS — F17210 Nicotine dependence, cigarettes, uncomplicated: Secondary | ICD-10-CM | POA: Insufficient documentation

## 2015-12-15 DIAGNOSIS — Z5321 Procedure and treatment not carried out due to patient leaving prior to being seen by health care provider: Secondary | ICD-10-CM | POA: Insufficient documentation

## 2015-12-15 DIAGNOSIS — Z792 Long term (current) use of antibiotics: Secondary | ICD-10-CM | POA: Diagnosis not present

## 2015-12-15 DIAGNOSIS — R109 Unspecified abdominal pain: Secondary | ICD-10-CM | POA: Diagnosis present

## 2015-12-15 DIAGNOSIS — G8929 Other chronic pain: Secondary | ICD-10-CM | POA: Diagnosis not present

## 2015-12-15 DIAGNOSIS — R1013 Epigastric pain: Secondary | ICD-10-CM | POA: Insufficient documentation

## 2015-12-15 DIAGNOSIS — Z5329 Procedure and treatment not carried out because of patient's decision for other reasons: Secondary | ICD-10-CM

## 2015-12-15 DIAGNOSIS — Z79899 Other long term (current) drug therapy: Secondary | ICD-10-CM | POA: Insufficient documentation

## 2015-12-15 DIAGNOSIS — M545 Low back pain: Secondary | ICD-10-CM | POA: Diagnosis not present

## 2015-12-15 DIAGNOSIS — Z791 Long term (current) use of non-steroidal anti-inflammatories (NSAID): Secondary | ICD-10-CM | POA: Insufficient documentation

## 2015-12-15 DIAGNOSIS — Z5320 Procedure and treatment not carried out because of patient's decision for unspecified reasons: Secondary | ICD-10-CM

## 2015-12-15 LAB — COMPREHENSIVE METABOLIC PANEL
ALK PHOS: 62 U/L (ref 38–126)
ALT: 31 U/L (ref 14–54)
ANION GAP: 5 (ref 5–15)
AST: 28 U/L (ref 15–41)
Albumin: 3.9 g/dL (ref 3.5–5.0)
BILIRUBIN TOTAL: 0.6 mg/dL (ref 0.3–1.2)
BUN: 17 mg/dL (ref 6–20)
CALCIUM: 9.3 mg/dL (ref 8.9–10.3)
CO2: 26 mmol/L (ref 22–32)
CREATININE: 0.51 mg/dL (ref 0.44–1.00)
Chloride: 109 mmol/L (ref 101–111)
Glucose, Bld: 99 mg/dL (ref 65–99)
Potassium: 3.6 mmol/L (ref 3.5–5.1)
SODIUM: 140 mmol/L (ref 135–145)
TOTAL PROTEIN: 6.9 g/dL (ref 6.5–8.1)

## 2015-12-15 LAB — CBC WITH DIFFERENTIAL/PLATELET
Basophils Absolute: 0.1 10*3/uL (ref 0–0.1)
Basophils Relative: 1 %
EOS ABS: 0.4 10*3/uL (ref 0–0.7)
Eosinophils Relative: 5 %
HCT: 34.1 % — ABNORMAL LOW (ref 35.0–47.0)
HEMOGLOBIN: 11.2 g/dL — AB (ref 12.0–16.0)
Lymphocytes Relative: 17 %
Lymphs Abs: 1.5 10*3/uL (ref 1.0–3.6)
MCH: 25.4 pg — AB (ref 26.0–34.0)
MCHC: 32.9 g/dL (ref 32.0–36.0)
MCV: 77.2 fL — AB (ref 80.0–100.0)
MONOS PCT: 7 %
Monocytes Absolute: 0.6 10*3/uL (ref 0.2–0.9)
NEUTROS PCT: 70 %
Neutro Abs: 6 10*3/uL (ref 1.4–6.5)
Platelets: 310 10*3/uL (ref 150–440)
RBC: 4.42 MIL/uL (ref 3.80–5.20)
RDW: 16.9 % — ABNORMAL HIGH (ref 11.5–14.5)
WBC: 8.6 10*3/uL (ref 3.6–11.0)

## 2015-12-15 LAB — LIPASE, BLOOD: LIPASE: 30 U/L (ref 11–51)

## 2015-12-15 MED ORDER — GI COCKTAIL ~~LOC~~
30.0000 mL | Freq: Once | ORAL | Status: AC
Start: 2015-12-15 — End: 2015-12-15
  Administered 2015-12-15: 30 mL via ORAL
  Filled 2015-12-15: qty 30

## 2015-12-15 MED ORDER — TRAMADOL HCL 50 MG PO TABS
50.0000 mg | ORAL_TABLET | Freq: Once | ORAL | Status: AC
  Administered 2015-12-15: 50 mg via ORAL
  Filled 2015-12-15: qty 1

## 2015-12-15 NOTE — ED Notes (Signed)
Pt complaining of being cold. Told pt I would bring her more warm blankets.

## 2015-12-15 NOTE — ED Notes (Signed)
Pt states she has siatica in her right leg and it "hurts so bad now my stomach hurts bc my ulcers are flared up bc i keep taking all this ibpuprofen"  Pt states "i want them to admit me"
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
## 2015-12-15 NOTE — ED Notes (Addendum)
Returned to pt's room with warm blankets. Pt is sitting on side of bed and states I can't do this. It is cold in this room and is making me ache, I have to get out of here. Pt states we are not going to do anything else for her and the Ultram is like taking a baby ASA so she will just leave. Pt removed BP cuff and walked out of room. Pt ambulated with no distress noted and able to walk with no assistance out of room and down hallway. MD made aware.

## 2015-12-26 ENCOUNTER — Ambulatory Visit: Payer: Managed Care, Other (non HMO) | Admitting: Neurology

## 2015-12-26 ENCOUNTER — Telehealth: Payer: Self-pay | Admitting: *Deleted

## 2015-12-26 NOTE — Telephone Encounter (Signed)
no showed NP appt 

## 2015-12-27 ENCOUNTER — Encounter: Payer: Self-pay | Admitting: Neurology

## 2015-12-30 ENCOUNTER — Emergency Department (HOSPITAL_COMMUNITY)
Admission: EM | Admit: 2015-12-30 | Discharge: 2015-12-30 | Disposition: A | Discharge status: Home / Self Care | Payer: Managed Care, Other (non HMO) | Attending: Emergency Medicine | Admitting: Emergency Medicine

## 2015-12-30 ENCOUNTER — Encounter (HOSPITAL_COMMUNITY): Payer: Self-pay | Admitting: Emergency Medicine

## 2015-12-30 DIAGNOSIS — G8929 Other chronic pain: Secondary | ICD-10-CM | POA: Insufficient documentation

## 2015-12-30 DIAGNOSIS — Z792 Long term (current) use of antibiotics: Secondary | ICD-10-CM | POA: Insufficient documentation

## 2015-12-30 DIAGNOSIS — Z79899 Other long term (current) drug therapy: Secondary | ICD-10-CM | POA: Diagnosis not present

## 2015-12-30 DIAGNOSIS — F17210 Nicotine dependence, cigarettes, uncomplicated: Secondary | ICD-10-CM | POA: Diagnosis not present

## 2015-12-30 DIAGNOSIS — Z8781 Personal history of (healed) traumatic fracture: Secondary | ICD-10-CM | POA: Insufficient documentation

## 2015-12-30 DIAGNOSIS — R6884 Jaw pain: Secondary | ICD-10-CM | POA: Diagnosis present

## 2015-12-30 MED ORDER — OXYCODONE-ACETAMINOPHEN 5-325 MG PO TABS
2.0000 | ORAL_TABLET | Freq: Once | ORAL | Status: AC
  Administered 2015-12-30: 2 via ORAL
  Filled 2015-12-30: qty 2

## 2015-12-30 NOTE — Discharge Instructions (Signed)
Please follow-up with primary care for further management of your symptoms. Return to ED for any new or worsening symptoms.
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
## 2015-12-30 NOTE — ED Notes (Signed)
Pt c/o TMJ flare up to right side of mouth onset 2 1/2 days.

## 2016-01-28 ENCOUNTER — Ambulatory Visit: Payer: Managed Care, Other (non HMO) | Admitting: Family Medicine

## 2016-05-28 ENCOUNTER — Emergency Department (HOSPITAL_COMMUNITY)
Admission: EM | Admit: 2016-05-28 | Discharge: 2016-05-28 | Disposition: A | Discharge status: Home / Self Care | Payer: Self-pay | Attending: Emergency Medicine | Admitting: Emergency Medicine

## 2016-05-28 ENCOUNTER — Encounter (HOSPITAL_COMMUNITY): Payer: Self-pay

## 2016-05-28 ENCOUNTER — Emergency Department (HOSPITAL_COMMUNITY): Payer: Self-pay

## 2016-05-28 DIAGNOSIS — W208XXA Other cause of strike by thrown, projected or falling object, initial encounter: Secondary | ICD-10-CM | POA: Insufficient documentation

## 2016-05-28 DIAGNOSIS — Z791 Long term (current) use of non-steroidal anti-inflammatories (NSAID): Secondary | ICD-10-CM | POA: Insufficient documentation

## 2016-05-28 DIAGNOSIS — Y939 Activity, unspecified: Secondary | ICD-10-CM | POA: Insufficient documentation

## 2016-05-28 DIAGNOSIS — S43401A Unspecified sprain of right shoulder joint, initial encounter: Secondary | ICD-10-CM | POA: Insufficient documentation

## 2016-05-28 DIAGNOSIS — Y929 Unspecified place or not applicable: Secondary | ICD-10-CM | POA: Insufficient documentation

## 2016-05-28 DIAGNOSIS — Y999 Unspecified external cause status: Secondary | ICD-10-CM | POA: Insufficient documentation

## 2016-05-28 MED ORDER — HYDROMORPHONE HCL 1 MG/ML IJ SOLN
1.0000 mg | Freq: Once | INTRAMUSCULAR | Status: DC
  Filled 2016-05-28: qty 1

## 2016-05-28 MED ORDER — HYDROMORPHONE HCL 1 MG/ML IJ SOLN
1.0000 mg | Freq: Once | INTRAMUSCULAR | Status: AC
  Administered 2016-05-28: 1 mg via INTRAMUSCULAR

## 2016-05-28 MED ORDER — HYDROCODONE-ACETAMINOPHEN 5-325 MG PO TABS: 1.0000 | ORAL_TABLET | Freq: Four times a day (QID) | ORAL | Status: DC | PRN

## 2016-05-28 NOTE — ED Notes (Signed)
Patient c/o right shoulder pain. Patient states she was picking up a grill and the grill fell on her right shoulder. Patient has swelling and deformity of the right shoulder.
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
## 2016-05-28 NOTE — Discharge Instructions (Signed)
It was our pleasure to provide your ER care today - we hope that you feel better.  Take motrin or aleve as need for pain. You may also take hydrocodone as need for pain. No driving for the next 6 hours or when taking hydrocodone. Also, do not take tylenol or acetaminophen containing medication when taking hydrocodone.  Wear sling for comfort/support as need for the next few days.  Icepack/cold to sore area.  Follow up with orthopedist in 1 week if symptoms fail to improve/resolve.  Return to ER if worse, new symptoms, intractable pain, numbness/weakness, other concern.  You were given pain medication in the ER - no driving for the next 4 hours.     Shoulder Sprain A shoulder sprain is a partial or complete tear in one of the tough, fiber-like tissues (ligaments) in the shoulder. The ligaments in the shoulder help to hold the shoulder in place. CAUSES This condition may be caused by:  A fall.  A hit to the shoulder.  A twist of the arm. RISK FACTORS This condition is more likely to develop in:  People who play sports.  People who have problems with balance or coordination. SYMPTOMS Symptoms of this condition include:  Pain when moving the shoulder.  Limited ability to move the shoulder.  Swelling and tenderness on top of the shoulder.  Warmth in the shoulder.  A change in the shape of the shoulder.  Redness or bruising on the shoulder. DIAGNOSIS This condition is diagnosed with a physical exam. During the exam, you may be asked to do simple exercises with your shoulder. You may also have imaging tests, such as X-rays, MRI, or a CT scan. These tests can show how severe the sprain is. TREATMENT This condition may be treated with:  Rest.  Pain medicine.  Ice.  A sling or brace. This is used to keep the arm still while the shoulder is healing.  Physical therapy or rehabilitation exercises. These help to improve the range of motion and strength of the  shoulder.  Surgery (rare). Surgery may be needed if the sprain caused a joint to become unstable. Surgery may also be needed to reduce pain. Some people may develop ongoing shoulder pain or lose some range of motion in the shoulder. However, most people do not develop long-term problems. HOME CARE INSTRUCTIONS  Rest.  Take over-the-counter and prescription medicines only as told by your health care provider.  If directed, apply ice to the area:  Put ice in a plastic bag.  Place a towel between your skin and the bag.  Leave the ice on for 20 minutes, 2-3 times per day.  If you were given a shoulder sling or brace:  Wear it as told.  Remove it to shower or bathe.  Move your arm only as much as told by your health care provider, but keep your hand moving to prevent swelling.  If you were shown how to do any exercises, do them as told by your health care provider.  Keep all follow-up visits as told by your health care provider. This is important. SEEK MEDICAL CARE IF:  Your pain gets worse.  Your pain is not relieved with medicines.  You have increased redness or swelling. SEEK IMMEDIATE MEDICAL CARE IF:  You have a fever.  You cannot move your arm or shoulder.  You develop numbness or tingling in your arms, hands, or fingers.   This information is not intended to replace advice given to you by your  health care provider. Make sure you discuss any questions you have with your health care provider.   Document Released: 04/19/2009 Document Revised: 08/22/2015 Document Reviewed: 03/26/2015 Elsevier Interactive Patient Education 2016 [employer]   Cryotherapy Cryotherapy means treatment with cold. Ice or gel packs can be used to reduce both pain and swelling. Ice is the most helpful within the first 24 to 48 hours after an injury or flare-up from overusing a muscle or joint. Sprains, strains, spasms, burning pain, shooting pain, and aches can all be eased with ice. Ice  can also be used when recovering from surgery. Ice is effective, has very few side effects, and is safe for most people to use. PRECAUTIONS  Ice is not a safe treatment option for people with:  Raynaud phenomenon. This is a condition affecting small blood vessels in the extremities. Exposure to cold may cause your problems to return.  Cold hypersensitivity. There are many forms of cold hypersensitivity, including:  Cold urticaria. Red, itchy hives appear on the skin when the tissues begin to warm after being iced.  Cold erythema. This is a red, itchy rash caused by exposure to cold.  Cold hemoglobinuria. Red blood cells break down when the tissues begin to warm after being iced. The hemoglobin that carry oxygen are passed into the urine because they cannot combine with blood proteins fast enough.  Numbness or altered sensitivity in the area being iced. If you have any of the following conditions, do not use ice until you have discussed cryotherapy with your caregiver:  Heart conditions, such as arrhythmia, angina, or chronic heart disease.  High blood pressure.  Healing wounds or open skin in the area being iced.  Current infections.  Rheumatoid arthritis.  Poor circulation.  Diabetes. Ice slows the blood flow in the region it is applied. This is beneficial when trying to stop inflamed tissues from spreading irritating chemicals to surrounding tissues. However, if you expose your skin to cold temperatures for too long or without the proper protection, you can damage your skin or nerves. Watch for signs of skin damage due to cold. HOME CARE INSTRUCTIONS Follow these tips to use ice and cold packs safely.  Place a dry or damp towel between the ice and skin. A damp towel will cool the skin more quickly, so you may need to shorten the time that the ice is used.  For a more rapid response, add gentle compression to the ice.  Ice for no more than 10 to 20 minutes at a time. The bonier  the area you are icing, the less time it will take to get the benefits of ice.  Check your skin after 5 minutes to make sure there are no signs of a poor response to cold or skin damage.  Rest 20 minutes or more between uses.  Once your skin is numb, you can end your treatment. You can test numbness by very lightly touching your skin. The touch should be so light that you do not see the skin dimple from the pressure of your fingertip. When using ice, most people will feel these normal sensations in this order: cold, burning, aching, and numbness.  Do not use ice on someone who cannot communicate their responses to pain, such as small children or people with dementia. HOW TO MAKE AN ICE PACK Ice packs are the most common way to use ice therapy. Other methods include ice massage, ice baths, and cryosprays. Muscle creams that cause a cold, tingly feeling do  not offer the same benefits that ice offers and should not be used as a substitute unless recommended by your caregiver. To make an ice pack, do one of the following:  Place crushed ice or a bag of frozen vegetables in a sealable plastic bag. Squeeze out the excess air. Place this bag inside another plastic bag. Slide the bag into a pillowcase or place a damp towel between your skin and the bag.  Mix 3 parts water with 1 part rubbing alcohol. Freeze the mixture in a sealable plastic bag. When you remove the mixture from the freezer, it will be slushy. Squeeze out the excess air. Place this bag inside another plastic bag. Slide the bag into a pillowcase or place a damp towel between your skin and the bag. SEEK MEDICAL CARE IF:  You develop white spots on your skin. This may give the skin a blotchy (mottled) appearance.  Your skin turns blue or pale.  Your skin becomes waxy or hard.  Your swelling gets worse. MAKE SURE YOU:   Understand these instructions.  Will watch your condition.  Will get help right away if you are not doing well or  get worse.   This information is not intended to replace advice given to you by your health care provider. Make sure you discuss any questions you have with your health care provider.   Document Released: 07/28/2011 Document Revised: 12/22/2014 Document Reviewed: 07/28/2011 Elsevier Interactive Patient Education 2016 [employer].    How to Use a Sling A sling is a type of hanging bandage that is worn around your neck to protect an injured arm, shoulder, or other body part. You may need to wear a sling to keep you from moving (immobilize) the injured body part while it heals. Keeping the injured part of your body still reduces pain and speeds up healing. Your health care provider may recommend using a sling if you have:   A broken arm.  A broken collarbone.  A shoulder injury.  Surgery. RISKS AND COMPLICATIONS Wearing a sling the wrong way can:  Make your injury worse.  Cause stiffness or numbness.  Affect blood circulation in your arm and hand. This can causetingling or numbness in your fingers or hands. HOW TO USE A SLING The way that you should use a sling depends on your injury. It is important that you follow all of your health care provider's instructions for your injury. Also follow these general guidelines:  Wear the sling so that your arm bends 90 degrees at the elbow. That is like a right angle or the shape of a capital letter "L." The sling should also support your wrist and your hand.  Try to avoid moving your arm.  Do not lie down flat on your back while wearing a sling. Sleep in a recliner or use pillows to raise your upper body in bed.  Do not twist, raise, or move your arm in a way that could make your injury worse.  Do not lean on your arm while wearing a sling.  Do not lift anything while wearing a sling. SEEK MEDICAL CARE IF:  You have bruising, swelling, or pain that is getting worse.  Your pain medicine is not helping.  You have a fever. SEEK  IMMEDIATE MEDICAL CARE IF:  Your fingers are numb or tingling.  Your fingers turn blue or feel cold to the touch.  You cannot control the bleeding from your injury.  You are short of breath.  This information is not intended to replace advice given to you by your health care provider. Make sure you discuss any questions you have with your health care provider.   Document Released: 07/15/2004 Document Revised: 12/22/2014 Document Reviewed: 10/04/2014 Elsevier Interactive Patient Education [employer]

## 2016-05-29 ENCOUNTER — Encounter (HOSPITAL_COMMUNITY): Payer: Self-pay | Admitting: Emergency Medicine

## 2016-07-02 ENCOUNTER — Emergency Department (HOSPITAL_COMMUNITY): Payer: Self-pay

## 2016-07-02 ENCOUNTER — Encounter (HOSPITAL_COMMUNITY): Payer: Self-pay | Admitting: Emergency Medicine

## 2016-07-02 ENCOUNTER — Emergency Department (HOSPITAL_COMMUNITY)
Admission: EM | Admit: 2016-07-02 | Discharge: 2016-07-02 | Disposition: A | Discharge status: Home / Self Care | Payer: Self-pay | Attending: Emergency Medicine | Admitting: Emergency Medicine

## 2016-07-02 DIAGNOSIS — Z79899 Other long term (current) drug therapy: Secondary | ICD-10-CM | POA: Insufficient documentation

## 2016-07-02 DIAGNOSIS — Y999 Unspecified external cause status: Secondary | ICD-10-CM | POA: Insufficient documentation

## 2016-07-02 DIAGNOSIS — Y939 Activity, unspecified: Secondary | ICD-10-CM | POA: Insufficient documentation

## 2016-07-02 DIAGNOSIS — S20219A Contusion of unspecified front wall of thorax, initial encounter: Secondary | ICD-10-CM | POA: Insufficient documentation

## 2016-07-02 DIAGNOSIS — R1011 Right upper quadrant pain: Secondary | ICD-10-CM | POA: Insufficient documentation

## 2016-07-02 DIAGNOSIS — Y929 Unspecified place or not applicable: Secondary | ICD-10-CM | POA: Insufficient documentation

## 2016-07-02 DIAGNOSIS — R51 Headache: Secondary | ICD-10-CM | POA: Insufficient documentation

## 2016-07-02 DIAGNOSIS — S20211A Contusion of right front wall of thorax, initial encounter: Secondary | ICD-10-CM

## 2016-07-02 DIAGNOSIS — Z791 Long term (current) use of non-steroidal anti-inflammatories (NSAID): Secondary | ICD-10-CM | POA: Insufficient documentation

## 2016-07-02 HISTORY — DX: Other forms of stomatitis: K12.1

## 2016-07-02 LAB — CBC
HCT: 35.7 % — ABNORMAL LOW (ref 36.0–46.0)
HEMOGLOBIN: 12 g/dL (ref 12.0–15.0)
MCH: 28.4 pg (ref 26.0–34.0)
MCHC: 33.6 g/dL (ref 30.0–36.0)
MCV: 84.4 fL (ref 78.0–100.0)
Platelets: 244 10*3/uL (ref 150–400)
RBC: 4.23 MIL/uL (ref 3.87–5.11)
RDW: 15.3 % (ref 11.5–15.5)
WBC: 7.6 10*3/uL (ref 4.0–10.5)

## 2016-07-02 LAB — TYPE AND SCREEN
ABO/RH(D): B POS
ANTIBODY SCREEN: NEGATIVE

## 2016-07-02 LAB — URINALYSIS, ROUTINE W REFLEX MICROSCOPIC
Bilirubin Urine: NEGATIVE
GLUCOSE, UA: NEGATIVE mg/dL
Hgb urine dipstick: NEGATIVE
KETONES UR: NEGATIVE mg/dL
NITRITE: NEGATIVE
PROTEIN: NEGATIVE mg/dL
Specific Gravity, Urine: 1.01 (ref 1.005–1.030)
pH: 7 (ref 5.0–8.0)

## 2016-07-02 LAB — BASIC METABOLIC PANEL
Anion gap: 6 (ref 5–15)
BUN: 15 mg/dL (ref 6–20)
CHLORIDE: 109 mmol/L (ref 101–111)
CO2: 24 mmol/L (ref 22–32)
CREATININE: 0.63 mg/dL (ref 0.44–1.00)
Calcium: 9.3 mg/dL (ref 8.9–10.3)
Glucose, Bld: 98 mg/dL (ref 65–99)
Potassium: 3.6 mmol/L (ref 3.5–5.1)
SODIUM: 139 mmol/L (ref 135–145)

## 2016-07-02 LAB — URINE MICROSCOPIC-ADD ON: RBC / HPF: NONE SEEN RBC/hpf (ref 0–5)

## 2016-07-02 MED ORDER — SODIUM CHLORIDE 0.9 % IV SOLN
Freq: Once | INTRAVENOUS | Status: AC
  Administered 2016-07-02: 21:00:00 via INTRAVENOUS

## 2016-07-02 MED ORDER — HYDROCODONE-ACETAMINOPHEN 5-325 MG PO TABS: 2.0000 | ORAL_TABLET | ORAL | Status: DC | PRN

## 2016-07-02 MED ORDER — IOPAMIDOL (ISOVUE-300) INJECTION 61%
100.0000 mL | Freq: Once | INTRAVENOUS | Status: AC | PRN
  Administered 2016-07-02: 100 mL via INTRAVENOUS

## 2016-07-02 MED ORDER — HYDROCODONE-ACETAMINOPHEN 5-325 MG PO TABS
2.0000 | ORAL_TABLET | Freq: Once | ORAL | Status: AC
  Administered 2016-07-02: 2 via ORAL
  Filled 2016-07-02: qty 2

## 2016-07-02 MED ORDER — FENTANYL CITRATE (PF) 100 MCG/2ML IJ SOLN
50.0000 ug | Freq: Once | INTRAMUSCULAR | Status: AC
  Administered 2016-07-02: 50 ug via INTRAVENOUS
  Filled 2016-07-02: qty 2

## 2016-07-02 NOTE — ED Notes (Signed)
Pt in CT.
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
## 2016-07-02 NOTE — Discharge Instructions (Signed)
Motrin or Hydrocodone for pain Your xrays show no traumatic injuries You may hurt for another week or more as you heal

## 2016-07-02 NOTE — ED Notes (Signed)
Pt returned from CT at this time.  
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
## 2016-07-05 LAB — URINE CULTURE

## 2016-07-06 ENCOUNTER — Telehealth (HOSPITAL_BASED_OUTPATIENT_CLINIC_OR_DEPARTMENT_OTHER): Payer: Self-pay

## 2016-07-06 NOTE — Telephone Encounter (Signed)
Post ED Visit - Positive Culture Follow-up  Culture report reviewed by antimicrobial stewardship pharmacist:  []  Enzo Bi, Pharm.D. []  Celedonio Miyamoto, Pharm.D., BCPS []  Garvin Fila, Pharm.D. []  Georgina Pillion, Pharm.D., BCPS []  [place], [address].D., BCPS, AAHIVP []  Estella Husk, Pharm.D., BCPS, AAHIVP []  Tennis Must, Pharm.D. []  Sherle Poe, [address].D. Casilda Carls Pharm D Positive urine culture and no further patient follow-up is required at this time.  Jerry Caras 07/06/2016, 2:04 PM

## 2016-07-07 MED FILL — Hydrocodone-Acetaminophen Tab 5-325 MG: ORAL | Qty: 6 | Status: AC

## 2016-07-10 ENCOUNTER — Emergency Department (HOSPITAL_COMMUNITY): Payer: Self-pay

## 2016-07-10 ENCOUNTER — Emergency Department (HOSPITAL_COMMUNITY)
Admission: EM | Admit: 2016-07-10 | Discharge: 2016-07-10 | Disposition: A | Discharge status: Home / Self Care | Payer: Self-pay | Attending: Emergency Medicine | Admitting: Emergency Medicine

## 2016-07-10 ENCOUNTER — Encounter (HOSPITAL_COMMUNITY): Payer: Self-pay | Admitting: Emergency Medicine

## 2016-07-10 DIAGNOSIS — N3090 Cystitis, unspecified without hematuria: Secondary | ICD-10-CM

## 2016-07-10 DIAGNOSIS — N898 Other specified noninflammatory disorders of vagina: Secondary | ICD-10-CM

## 2016-07-10 DIAGNOSIS — R1030 Lower abdominal pain, unspecified: Secondary | ICD-10-CM

## 2016-07-10 DIAGNOSIS — Z3A22 22 weeks gestation of pregnancy: Secondary | ICD-10-CM | POA: Insufficient documentation

## 2016-07-10 DIAGNOSIS — N72 Inflammatory disease of cervix uteri: Secondary | ICD-10-CM

## 2016-07-10 DIAGNOSIS — R1032 Left lower quadrant pain: Secondary | ICD-10-CM | POA: Insufficient documentation

## 2016-07-10 DIAGNOSIS — O2312 Infections of bladder in pregnancy, second trimester: Secondary | ICD-10-CM | POA: Insufficient documentation

## 2016-07-10 DIAGNOSIS — Z791 Long term (current) use of non-steroidal anti-inflammatories (NSAID): Secondary | ICD-10-CM | POA: Insufficient documentation

## 2016-07-10 DIAGNOSIS — Z79899 Other long term (current) drug therapy: Secondary | ICD-10-CM | POA: Insufficient documentation

## 2016-07-10 HISTORY — DX: Diaphragmatic hernia without obstruction or gangrene: K44.9

## 2016-07-10 LAB — COMPREHENSIVE METABOLIC PANEL WITH GFR
ALT: 20 U/L (ref 14–54)
AST: 26 U/L (ref 15–41)
Albumin: 4.4 g/dL (ref 3.5–5.0)
Alkaline Phosphatase: 75 U/L (ref 38–126)
Anion gap: 5 (ref 5–15)
BUN: 19 mg/dL (ref 6–20)
CO2: 24 mmol/L (ref 22–32)
Calcium: 9.4 mg/dL (ref 8.9–10.3)
Chloride: 111 mmol/L (ref 101–111)
Creatinine, Ser: 0.7 mg/dL (ref 0.44–1.00)
GFR calc Af Amer: >60 mL/min
GFR calc non Af Amer: >60 mL/min
Glucose, Bld: 94 mg/dL (ref 65–99)
Potassium: 3.6 mmol/L (ref 3.5–5.1)
Sodium: 140 mmol/L (ref 135–145)
Total Bilirubin: 0.5 mg/dL (ref 0.3–1.2)
Total Protein: 8.3 g/dL — ABNORMAL HIGH (ref 6.5–8.1)

## 2016-07-10 LAB — URINALYSIS, ROUTINE W REFLEX MICROSCOPIC
Bilirubin Urine: NEGATIVE
Glucose, UA: NEGATIVE mg/dL
Hgb urine dipstick: NEGATIVE
Ketones, ur: NEGATIVE mg/dL
NITRITE: NEGATIVE
PH: 6.5 (ref 5.0–8.0)
Protein, ur: NEGATIVE mg/dL
SPECIFIC GRAVITY, URINE: 1.01 (ref 1.005–1.030)

## 2016-07-10 LAB — CBC
HEMATOCRIT: 37.7 % (ref 36.0–46.0)
HEMOGLOBIN: 12.7 g/dL (ref 12.0–15.0)
MCH: 28.5 pg (ref 26.0–34.0)
MCHC: 33.7 g/dL (ref 30.0–36.0)
MCV: 84.5 fL (ref 78.0–100.0)
Platelets: 325 10*3/uL (ref 150–400)
RBC: 4.46 MIL/uL (ref 3.87–5.11)
RDW: 15.2 % (ref 11.5–15.5)
WBC: 8.5 10*3/uL (ref 4.0–10.5)

## 2016-07-10 LAB — WET PREP, GENITAL
Clue Cells Wet Prep HPF POC: NONE SEEN
Sperm: NONE SEEN
TRICH WET PREP: NONE SEEN
YEAST WET PREP: NONE SEEN

## 2016-07-10 LAB — URINE MICROSCOPIC-ADD ON

## 2016-07-10 LAB — LIPASE, BLOOD: Lipase: 40 U/L (ref 11–51)

## 2016-07-10 MED ORDER — MORPHINE SULFATE (PF) 4 MG/ML IV SOLN
4.0000 mg | Freq: Once | INTRAVENOUS | Status: AC
  Administered 2016-07-10: 4 mg via INTRAVENOUS
  Filled 2016-07-10: qty 1

## 2016-07-10 MED ORDER — KETOROLAC TROMETHAMINE 15 MG/ML IJ SOLN: 15.0000 mg | Freq: Once | INTRAMUSCULAR | Status: DC

## 2016-07-10 MED ORDER — CEPHALEXIN 500 MG PO CAPS: 500.0000 mg | ORAL_CAPSULE | Freq: Three times a day (TID) | ORAL | 0 refills | Status: DC

## 2016-07-10 MED ORDER — SODIUM CHLORIDE 0.9 % IV BOLUS (SEPSIS)
1000.0000 mL | Freq: Once | INTRAVENOUS | Status: AC
  Administered 2016-07-10: 1000 mL via INTRAVENOUS

## 2016-07-10 MED ORDER — DEXTROSE 5 % IV SOLN
1.0000 g | Freq: Once | INTRAVENOUS | Status: AC
  Administered 2016-07-10: 1 g via INTRAVENOUS
  Filled 2016-07-10: qty 10

## 2016-07-10 MED ORDER — HYDROCODONE-ACETAMINOPHEN 5-325 MG PO TABS: 1.0000 | ORAL_TABLET | Freq: Four times a day (QID) | ORAL | 0 refills | Status: AC | PRN

## 2016-07-10 MED ORDER — OXYCODONE-ACETAMINOPHEN 5-325 MG PO TABS
2.0000 | ORAL_TABLET | Freq: Once | ORAL | Status: AC
  Administered 2016-07-10: 2 via ORAL
  Filled 2016-07-10: qty 2

## 2016-07-10 MED ORDER — AZITHROMYCIN 250 MG PO TABS
1000.0000 mg | ORAL_TABLET | Freq: Once | ORAL | Status: AC
  Administered 2016-07-10: 1000 mg via ORAL
  Filled 2016-07-10: qty 4

## 2016-07-10 MED ORDER — METRONIDAZOLE 500 MG PO TABS: 500.0000 mg | ORAL_TABLET | Freq: Two times a day (BID) | ORAL | 0 refills | Status: DC

## 2016-07-10 MED ORDER — ONDANSETRON HCL 4 MG/2ML IJ SOLN
4.0000 mg | Freq: Once | INTRAMUSCULAR | Status: AC
  Administered 2016-07-10: 4 mg via INTRAVENOUS
  Filled 2016-07-10: qty 2

## 2016-07-10 MED ORDER — ONDANSETRON HCL 4 MG PO TABS
4.0000 mg | ORAL_TABLET | Freq: Three times a day (TID) | ORAL | 0 refills | Status: DC | PRN
Start: 2016-07-10 — End: 2016-07-10

## 2016-07-10 MED ORDER — IOPAMIDOL (ISOVUE-300) INJECTION 61%
100.0000 mL | Freq: Once | INTRAVENOUS | Status: AC | PRN
Start: 2016-07-10 — End: 2016-07-10
  Administered 2016-07-10: 100 mL via INTRAVENOUS

## 2016-07-10 MED ORDER — PHENAZOPYRIDINE HCL 200 MG PO TABS: 200.0000 mg | ORAL_TABLET | Freq: Three times a day (TID) | ORAL | 0 refills | Status: DC | PRN

## 2016-07-10 MED ORDER — ONDANSETRON HCL 4 MG PO TABS: 4.0000 mg | ORAL_TABLET | Freq: Three times a day (TID) | ORAL | 0 refills | Status: AC | PRN

## 2016-07-10 NOTE — ED Triage Notes (Signed)
Pt reports hiatal hernia that was repaired in 2014, has abdominal pain in lower right and left quadrants starting Sunday.  Pt reports indigestion, denies n/v/d.  Pt does report that she is not urinating as much as usual.

## 2016-07-10 NOTE — ED Notes (Addendum)
Pt is requesting pain medication.  Dr Erma Heritage informed.
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
## 2016-07-10 NOTE — ED Notes (Addendum)
Pt c/o abdomen discomfort.  Pt is asking when she is going to CT.  Informed pt that CT tech with come get her when time, after constrast has outline the abd.

## 2016-07-11 LAB — GC/CHLAMYDIA PROBE AMP (~~LOC~~) NOT AT ARMC
CHLAMYDIA, DNA PROBE: NEGATIVE
Neisseria Gonorrhea: NEGATIVE

## 2016-07-13 LAB — URINE CULTURE: Culture: >=100000 — AB

## 2016-07-14 ENCOUNTER — Telehealth (HOSPITAL_COMMUNITY): Payer: Self-pay

## 2016-07-14 NOTE — Telephone Encounter (Signed)
Post ED Visit - Positive Culture Follow-up: Chart Hand-off to ED Flow Manager  Culture assessed and recommendations reviewed by: []  Isaac Bliss, Pharm.D., BCPS []  Celedonio Miyamoto, Pharm.D., BCPS-AQ ID []  Georgina Pillion, Pharm.D., BCPS []  [place], [place].D., BCPS, AAHIVP [x]  Estella Husk, Pharm .D., BCPS, AAHIVP []  Tennis Must, Pharm.D. []  Casilda Carls, Pharm.D.  Positive urine culture  []  Patient discharged without antimicrobial prescription and treatment is now indicated [x]  Organism is resistant to prescribed ED discharge antimicrobial []  Patient with positive blood cultures  Changes discussed with ED provider:Abigail Harris PA New antibiotic prescription If not improving stop keflex and start levaquin 500 mg po Q day x 7 days.  Finish flagyl  Unable to reach by telephone. Letter sent to address on record.   Ashley Jacobs 07/14/2016, 1:32 PM

## 2016-07-14 NOTE — Progress Notes (Signed)
ED Antimicrobial Stewardship Positive Culture Follow Up   Sharon Patel is an 55 y.o. female who presented to [facility] on 07/10/2016 with a chief complaint of  Chief Complaint  Patient presents with  . Abdominal Pain    Recent Results (from the past 720 hour(s))  Urine culture     Status: Abnormal   Collection Time: 07/02/16  7:37 PM  Result Value Ref Range Status   Specimen Description URINE, CLEAN CATCH  Final   Special Requests NONE  Final   Culture 40,000 COLONIES/mL ESCHERICHIA COLI (A)  Final   Report Status 07/05/2016 FINAL  Final   Organism ID, Bacteria ESCHERICHIA COLI (A)  Final      Susceptibility   Escherichia coli - MIC*    AMPICILLIN >=32 RESISTANT Resistant     CEFAZOLIN <=4 SENSITIVE Sensitive     CEFTRIAXONE <=1 SENSITIVE Sensitive     CIPROFLOXACIN <=0.25 SENSITIVE Sensitive     GENTAMICIN <=1 SENSITIVE Sensitive     IMIPENEM <=0.25 SENSITIVE Sensitive     NITROFURANTOIN <=16 SENSITIVE Sensitive     TRIMETH/SULFA <=20 SENSITIVE Sensitive     AMPICILLIN/SULBACTAM 16 INTERMEDIATE Intermediate     PIP/TAZO <=4 SENSITIVE Sensitive     * 40,000 COLONIES/mL ESCHERICHIA COLI  Urine culture     Status: Abnormal   Collection Time: 07/10/16 11:14 AM  Result Value Ref Range Status   Specimen Description URINE, CLEAN CATCH  Final   Special Requests NONE  Final   Culture (A)  Final    >=100,000 COLONIES/mL ESCHERICHIA COLI 60,000 COLONIES/mL ENTEROCOCCUS SPECIES    Report Status 07/13/2016 FINAL  Final   Organism ID, Bacteria ESCHERICHIA COLI (A)  Final   Organism ID, Bacteria ENTEROCOCCUS SPECIES (A)  Final      Susceptibility   Escherichia coli - MIC*    AMPICILLIN >=32 RESISTANT Resistant     CEFAZOLIN <=4 SENSITIVE Sensitive     CEFTRIAXONE <=1 SENSITIVE Sensitive     CIPROFLOXACIN <=0.25 SENSITIVE Sensitive     GENTAMICIN <=1 SENSITIVE Sensitive     IMIPENEM <=0.25 SENSITIVE Sensitive     NITROFURANTOIN <=16 SENSITIVE Sensitive     TRIMETH/SULFA <=20  SENSITIVE Sensitive     AMPICILLIN/SULBACTAM 16 INTERMEDIATE Intermediate     PIP/TAZO <=4 SENSITIVE Sensitive     * >=100,000 COLONIES/mL ESCHERICHIA COLI   Enterococcus species - MIC*    AMPICILLIN <=2 SENSITIVE Sensitive     LEVOFLOXACIN 0.5 SENSITIVE Sensitive     NITROFURANTOIN <=16 SENSITIVE Sensitive     VANCOMYCIN 1 SENSITIVE Sensitive     * 60,000 COLONIES/mL ENTEROCOCCUS SPECIES  Wet prep, genital     Status: Abnormal   Collection Time: 07/10/16  4:33 PM  Result Value Ref Range Status   Yeast Wet Prep HPF POC NONE SEEN NONE SEEN Final   Trich, Wet Prep NONE SEEN NONE SEEN Final   Clue Cells Wet Prep HPF POC NONE SEEN NONE SEEN Final   WBC, Wet Prep HPF POC MANY (A) NONE SEEN Final   Sperm NONE SEEN  Final    [x]  Treated with Cephalexin, organism resistant to prescribed antimicrobial []  Patient discharged originally without antimicrobial agent and treatment is now indicated  The Enterococcus may not be pathogenic.  Flow manager to call patient. If she is improving, she should finish her Keflex and Flagyl.   If she is not improving: Stop Keflex.   Finish Flagyl. Start Levaquin 500mg  PO qday x 7 days  ED Provider: Arthor Captain, PA-C  Sallee Provencal 07/14/2016, 11:18 AM Infectious Diseases Pharmacist Phone# [phone]

## 2016-07-18 ENCOUNTER — Encounter (HOSPITAL_COMMUNITY): Payer: Self-pay

## 2016-07-18 ENCOUNTER — Emergency Department (HOSPITAL_COMMUNITY)
Admission: EM | Admit: 2016-07-18 | Discharge: 2016-07-19 | Disposition: A | Discharge status: Home / Self Care | Payer: Self-pay | Attending: Emergency Medicine | Admitting: Emergency Medicine

## 2016-07-18 DIAGNOSIS — R109 Unspecified abdominal pain: Secondary | ICD-10-CM

## 2016-07-18 DIAGNOSIS — R1032 Left lower quadrant pain: Secondary | ICD-10-CM | POA: Insufficient documentation

## 2016-07-18 LAB — COMPREHENSIVE METABOLIC PANEL
ALT: 19 U/L (ref 14–54)
ANION GAP: 10 (ref 5–15)
AST: 22 U/L (ref 15–41)
Albumin: 4.3 g/dL (ref 3.5–5.0)
Alkaline Phosphatase: 73 U/L (ref 38–126)
BUN: 15 mg/dL (ref 6–20)
CHLORIDE: 110 mmol/L (ref 101–111)
CO2: 20 mmol/L — ABNORMAL LOW (ref 22–32)
CREATININE: 0.58 mg/dL (ref 0.44–1.00)
Calcium: 9.5 mg/dL (ref 8.9–10.3)
Glucose, Bld: 100 mg/dL — ABNORMAL HIGH (ref 65–99)
POTASSIUM: 3.7 mmol/L (ref 3.5–5.1)
SODIUM: 140 mmol/L (ref 135–145)
Total Bilirubin: 0.4 mg/dL (ref 0.3–1.2)
Total Protein: 8 g/dL (ref 6.5–8.1)

## 2016-07-18 LAB — URINALYSIS, ROUTINE W REFLEX MICROSCOPIC
Bilirubin Urine: NEGATIVE
GLUCOSE, UA: NEGATIVE mg/dL
Hgb urine dipstick: NEGATIVE
Ketones, ur: NEGATIVE mg/dL
LEUKOCYTES UA: NEGATIVE
Nitrite: NEGATIVE
PROTEIN: NEGATIVE mg/dL
SPECIFIC GRAVITY, URINE: 1.02 (ref 1.005–1.030)
pH: 6 (ref 5.0–8.0)

## 2016-07-18 LAB — CBC
HCT: 36.8 % (ref 36.0–46.0)
HEMOGLOBIN: 12.4 g/dL (ref 12.0–15.0)
MCH: 28.2 pg (ref 26.0–34.0)
MCHC: 33.7 g/dL (ref 30.0–36.0)
MCV: 83.6 fL (ref 78.0–100.0)
PLATELETS: 312 10*3/uL (ref 150–400)
RBC: 4.4 MIL/uL (ref 3.87–5.11)
RDW: 15 % (ref 11.5–15.5)
WBC: 7.5 10*3/uL (ref 4.0–10.5)

## 2016-07-18 LAB — LIPASE, BLOOD: LIPASE: 40 U/L (ref 11–51)

## 2016-07-18 NOTE — ED Triage Notes (Signed)
Diagnosed with UTI 2 weeks ago. Pain continues.

## 2016-07-19 ENCOUNTER — Emergency Department (HOSPITAL_COMMUNITY): Payer: Self-pay

## 2016-07-19 LAB — POC OCCULT BLOOD, ED: Fecal Occult Bld: NEGATIVE

## 2016-07-19 MED ORDER — PANTOPRAZOLE SODIUM 40 MG PO TBEC: 40.0000 mg | DELAYED_RELEASE_TABLET | Freq: Every day | ORAL | 1 refills | Status: AC

## 2016-07-19 MED ORDER — ONDANSETRON HCL 4 MG/2ML IJ SOLN
4.0000 mg | Freq: Once | INTRAMUSCULAR | Status: AC
  Administered 2016-07-19: 4 mg via INTRAVENOUS
  Filled 2016-07-19: qty 2

## 2016-07-19 MED ORDER — MORPHINE SULFATE (PF) 4 MG/ML IV SOLN
4.0000 mg | Freq: Once | INTRAVENOUS | Status: AC
  Administered 2016-07-19: 4 mg via INTRAVENOUS
  Filled 2016-07-19: qty 1

## 2016-07-19 MED ORDER — HYDROMORPHONE HCL 1 MG/ML IJ SOLN
1.0000 mg | Freq: Once | INTRAMUSCULAR | Status: AC
  Administered 2016-07-19: 1 mg via INTRAVENOUS
  Filled 2016-07-19: qty 1

## 2016-07-19 MED ORDER — DICYCLOMINE HCL 20 MG PO TABS: 20.0000 mg | ORAL_TABLET | Freq: Two times a day (BID) | ORAL | 0 refills | Status: AC
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
## 2016-07-19 NOTE — ED Notes (Signed)
Patient verbalizes understanding of discharge instructions, prescriptions, home care and follow up care. Patient out of department at this time. 

## 2016-07-19 NOTE — Discharge Instructions (Signed)
To find a primary care or specialty doctor please call [phone] or [phone] to access "[facility] Find a Doctor Service." ° °You may also go on the [facility] website at [email or web address] ° °There are also multiple [facility], [facility] and [facility] practices throughout the [place] that are frequently accepting new patients. You may find a clinic that is close to your home and contact them. ° °[facility] -  °[address] °[place]  [address] [phone] ° °[facility] in [place] (also locations in [place] and [place]) -  °[address] °[place] [address] [phone] ° °[facility] -  °[address] °[place]  [address] [phone] ° ° °

## 2016-07-29 ENCOUNTER — Telehealth: Payer: Self-pay | Admitting: *Deleted

## 2016-07-29 NOTE — Telephone Encounter (Signed)
Letter to home returned with incorrect address.  Unable to contact via phone.  No further treatment given.

## 2016-08-11 ENCOUNTER — Encounter: Payer: Self-pay | Admitting: *Deleted
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
